# Patient Record
Sex: Male | Born: 1960 | Race: White | Hispanic: No | Marital: Married | Smoking: Never smoker
Health system: Southern US, Community
[De-identification: ages and names within clinical notes are randomized; demographics above are authoritative.]

## PROBLEM LIST (undated history)

## (undated) ENCOUNTER — Encounter (HOSPITAL_COMMUNITY): Admission: RE | Payer: Self-pay | Source: Ambulatory Visit

## (undated) DIAGNOSIS — E669 Obesity, unspecified: Secondary | ICD-10-CM

## (undated) DIAGNOSIS — I10 Essential (primary) hypertension: Secondary | ICD-10-CM

## (undated) DIAGNOSIS — L818 Other specified disorders of pigmentation: Secondary | ICD-10-CM

## (undated) DIAGNOSIS — Z87440 Personal history of urinary (tract) infections: Secondary | ICD-10-CM

## (undated) DIAGNOSIS — R002 Palpitations: Secondary | ICD-10-CM

## (undated) DIAGNOSIS — J329 Chronic sinusitis, unspecified: Secondary | ICD-10-CM

## (undated) DIAGNOSIS — H9319 Tinnitus, unspecified ear: Secondary | ICD-10-CM

## (undated) DIAGNOSIS — J45909 Unspecified asthma, uncomplicated: Secondary | ICD-10-CM

## (undated) DIAGNOSIS — H538 Other visual disturbances: Secondary | ICD-10-CM

## (undated) DIAGNOSIS — C8590 Non-Hodgkin lymphoma, unspecified, unspecified site: Secondary | ICD-10-CM

## (undated) DIAGNOSIS — C801 Malignant (primary) neoplasm, unspecified: Secondary | ICD-10-CM

## (undated) DIAGNOSIS — N419 Inflammatory disease of prostate, unspecified: Secondary | ICD-10-CM

## (undated) HISTORY — PX: HX LYMPH NODE DISSECTION: SHX13

## (undated) HISTORY — DX: Non-Hodgkin lymphoma, unspecified, unspecified site (CMS HCC): C85.90

## (undated) HISTORY — PX: HX COLONOSCOPY: 2100001147

## (undated) HISTORY — DX: Essential (primary) hypertension: I10

## (undated) HISTORY — PX: HX TONSILLECTOMY: SHX27

## (undated) HISTORY — PX: HX ADENOIDECTOMY: SHX29

## (undated) HISTORY — DX: Unspecified asthma, uncomplicated: J45.909

## (undated) HISTORY — PX: HX EYE SURGERY: 2100001143

## (undated) HISTORY — DX: Non-Hodgkin lymphoma, unspecified, unspecified site: C85.90

## (undated) SURGERY — CRANIOTOMY FOR TUMOR WITH NAVIGATION
Anesthesia: General

---

## 2013-07-28 ENCOUNTER — Ambulatory Visit (HOSPITAL_COMMUNITY): Payer: Self-pay | Admitting: Surgery

## 2013-08-02 ENCOUNTER — Other Ambulatory Visit: Payer: Self-pay | Admitting: Surgery

## 2013-08-02 ENCOUNTER — Encounter (FREE_STANDING_LABORATORY_FACILITY): Admit: 2013-08-02 | Discharge: 2013-08-02 | Disposition: A | Discharge status: Home / Self Care | Payer: Self-pay

## 2013-08-05 LAB — HISTORICAL FLOW CYTOMETRY, TISSUE

## 2013-08-07 ENCOUNTER — Ambulatory Visit (INDEPENDENT_AMBULATORY_CARE_PROVIDER_SITE_OTHER): Payer: BC Managed Care – PPO | Admitting: Otolaryngology

## 2013-08-07 ENCOUNTER — Ambulatory Visit (INDEPENDENT_AMBULATORY_CARE_PROVIDER_SITE_OTHER): Payer: BC Managed Care – PPO | Admitting: Audiologist

## 2013-08-07 ENCOUNTER — Encounter (INDEPENDENT_AMBULATORY_CARE_PROVIDER_SITE_OTHER): Payer: Self-pay | Admitting: Otolaryngology

## 2013-08-07 VITALS — Resp 16 | Ht 71.0 in | Wt 250.0 lb

## 2013-08-07 MED ORDER — BECLOMETHASONE DIPROPIONATE 80 MCG/ACTUATION NASAL HFA INHALER
160.0000 ug | INHALATION_SPRAY | Freq: Every day | NASAL | Status: DC
Start: 2013-08-07 — End: 2017-02-06

## 2013-08-07 MED ORDER — AZELASTINE 137 MCG (0.1 %) NASAL SPRAY AEROSOL
2.00 | INHALATION_SPRAY | Freq: Two times a day (BID) | NASAL | Status: DC
Start: 2013-08-07 — End: 2013-10-21

## 2013-08-07 MED ORDER — LEVOFLOXACIN 750 MG TABLET
750.0000 mg | ORAL_TABLET | Freq: Every day | ORAL | Status: DC
Start: 2013-08-07 — End: 2013-10-14

## 2013-08-07 MED ORDER — CETIRIZINE 10 MG TABLET
10.0000 mg | ORAL_TABLET | Freq: Every day | ORAL | Status: DC
Start: 2013-08-07 — End: 2013-10-14

## 2013-08-07 MED ORDER — MONTELUKAST 10 MG TABLET
10.00 mg | ORAL_TABLET | Freq: Every evening | ORAL | Status: DC
Start: 2013-08-07 — End: 2013-10-14

## 2013-08-07 NOTE — Progress Notes (Signed)
See scan.

## 2013-08-07 NOTE — Progress Notes (Signed)
[facility]  [address]  [place] [address] [address]  [phone]      Date: 08/07/2013  Name: Jason Ball  Age: 52 y.o.  DOB:      Chief Complaint: Abnormal results      History of Present Illness:   Patient presents for evaluation of enlarged cervical lymph nodes, sinus disease, and abnormal CT scan. He is currently on Levaquin for a sinus infection. He has been having sinus issues including nasal congestion, PND, facial pressure and pain and headache for more than 12 weeks. He has very thick yellow nasal drainage. It has been several years.  He is not currently on nasal steroids or antihistamines. He has been treated in the past by Dr. Fidela Juneau. He has not had a recent CT of his sinuses but recently had a CT soft tissue neck for lymphadenopathy. He had a left axillary lymph node biopsy performed by Dr. Rubye Oaks last week. He has not been out of the country in the past 21 days. His risk factors for HIV are negative. He has a positive history of asthma that is well controlled at this time. He works in [employer] [place]  He also complains of ringing in his ears, worse on the left side.      Past Medical History:     Past Medical History   Diagnosis Date   . HTN (hypertension)          Past Surgical History   Procedure Laterality Date   . Hx lymph node dissection       Current Outpatient Prescriptions   Medication Sig   . amLODIPine-benazepril (LOTREL) 5-20 mg Oral Capsule Take 1 Cap by mouth Once a day   . aspirin (ECOTRIN) 81 mg Oral Tablet, Delayed Release (E.C.) Take 81 mg by mouth Once a day   . Azelastine 137 mcg Nasal Aerosol, Spray 2 Sprays by Nasal route Twice daily Use in each nostril as directed   . beclomethasone dipropionate (QNASL) 80 mcg/actuation Nasal HFA Aerosol Inhaler 160 mcg by Nasal route Once a day   . cetirizine (ZYRTEC) 10 mg Oral Tablet Take 1 Tab (10 mg total) by mouth Once a day    . fluticasone (FLONASE) 50 mcg/actuation Nasal Spray, Suspension 1 Spray by Each Nostril route Once a day   . levofloxacin (LEVAQUIN) 500 mg Oral Tablet Take 500 mg by mouth Once a day   . levofloxacin (LEVAQUIN) 750 mg Oral Tablet Take 1 Tab (750 mg total) by mouth Once a day   . montelukast (SINGULAIR) 10 mg Oral Tablet Take 1 Tab (10 mg total) by mouth Every evening     No Known Allergies  Family History   Problem Relation Age of Onset   . HTN <20 y.o.     . Heart Disease       History   Substance Use Topics   . Smoking status: Never Smoker    . Smokeless tobacco: Never Used   . Alcohol Use: No        Review of Systems:     CONSTITUTIONAL: Patient denies any fatigue, weight loss, fever or chills.  EYES: Patient denies any double vision, blurred vision or loss of vision.  ENT: See HPI  CARDIOVASCULAR: Patient denies any heart racing.  RESPIRATORY: Patient denies any shortness of breath, noisy breathing, wheezing asthma or cough.   NEUROLOGICAL: Patient denies any numbness, tingling, seizures or headaches.  GI:  Patient denies any nausea, vomiting, indigestion, or heartburn.   GU: Patient denies any increased urinary frequency or painful urination.   ENDOCRINE: Patient denies having any brittle hair, hot or cold flashes.   SKIN: Patient denies any rashes or lesions.   MUSCULOSKELETAL: Patient denies any muscle aches or joint aches.  HEM/LYMPH: Patient denies any bleeding or easy bruising.  ALLERGIC/IMM: Patient denies any itchy eyes, ears, nose or palate, watery eyes, scratchy throat, or sneezing excessively.  PSYCHIATRIC: Patient denies any anxiety or depression.    Physical Examination:     Resp 16   Ht 1.803 m (5\' 11" )   Wt 113.399 kg (250 lb)   BMI 34.88 kg/m2    GENERAL: Patient is in no acute distress.  HEAD: Head is normocephalic, atraumatic. No palpable salivary gland masses.  FACE: Face is symmetric, cranial nerve 7 is intact bilaterally.  EYES: PERRL, EOMI. Sclera is white.   EARS: External auditory canals are clear. Tympanic membranes are translucent and health appearing bilaterally.   NOSE: Intranasally, no pus, polyps or epistaxis is appreciated. Mild DNS to the right. Bilateral turbinate hypertrophy.  ORAL CAVITY: Healthy appearing lips, teeth, tongue, and gums. There are no visible or palpable masses or lesions.  OROPHARYNX: Mild PND.  NECK: Trachea is midline. No masses are palpated.  LYMPH: Bilateral lymphadenopathy.  NEUROLOGICAL: Cranial nerves 2 through 12 are grossly intact.   SKIN: Skin is warm and dry to touch.  RESPIRATORY: No stridor.  MUSCULOSKELETAL: Extremities move equally well.  PSYCHIATRIC: Patient is pleasant, cooperative and alert.     Procedure:   The nasal cavity was sprayed with a mixture of afrin/lidocaine to decongest and anesthetize the nasal cavity.  A 0 degree rigid scope was first use to evaluate the right nasal cavity.  The middle meatus was free of pusor polyp.  The frontal recess was patent without evidence of pus or polyp.  The sphenoethmoid recess was then evaluated.  There is polypoid disease in the sphenoethmoid recess left greater than right.  No synechia were present within the nasal cavity.  The turbinates were enlarged bilaterally.  The patient tolerated the procedure well with minimal discomfort.         Data Reviewed:   Ct soft tissue neck on 07/28/13 showed numerous prominent and mildly enlarged cervical chain lymph nodes bilaterally. Extensive paranasal sinus inflammatory changes. There is complete opacification of the ethmoid air cells with resorptive changes of the medial wall left orbit.  Question of resorptive changes left cribiform region    Pathology report from axillary node biopsy was carefully reviewed    AUDIOGRAM (I have personally reviewed the study)  Hearing WNL through 2k Hz sloping to a mild SNHL with excellent SD AU    TYMPANOGRAM (I have personally and interpreted the study)  AD -   Ad  AS -  A      Assessment:        ICD-9-CM    1. 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  Lymphadenopathy 785.6    4. DNS (deviated nasal septum) 470 NASAL ENDOSCOPY DIAGNOSTIC-RIGID (AMB ONLY)     Azelastine 137 mcg Nasal Aerosol, Spray     cetirizine (ZYRTEC) 10 mg Oral Tablet     CT SINUSES WO IV CONTRAST     CT SINUSES WO IV CONTRAST   5. Nasal turbinate hypertrophy 478.0 beclomethasone dipropionate (QNASL) 80 mcg/actuation Nasal HFA Aerosol Inhaler     NASAL ENDOSCOPY DIAGNOSTIC-RIGID (AMB ONLY)     Azelastine 137 mcg Nasal Aerosol, Spray     cetirizine (ZYRTEC) 10 mg Oral Tablet     CT SINUSES WO IV CONTRAST     CT SINUSES WO IV CONTRAST   6. AR (allergic rhinitis) 477.9 montelukast (SINGULAIR) 10 mg Oral Tablet     cetirizine (ZYRTEC) 10 mg Oral Tablet     CT SINUSES WO IV CONTRAST     CT SINUSES WO IV CONTRAST   7. Tinnitus 388.30    8. History of asthma V12.69 montelukast (SINGULAIR) 10 mg Oral Tablet   9. SNHL (sensorineural hearing loss) 389.10        Plan:   [place] was seen today for abnormal results.    Diagnoses and associated orders for this visit:    Chronic sinusitis  - beclomethasone dipropionate (QNASL) 80 mcg/actuation Nasal HFA Aerosol Inhaler; 160 mcg by Nasal route Once a day  - Nasal Endoscopy  - Azelastine 137 mcg Nasal Aerosol, Spray; 2 Sprays by Nasal route Twice daily Use in each nostril as directed  - cetirizine (ZYRTEC) 10 mg Oral Tablet; Take 1 Tab (10 mg total) by mouth Once a day  - levofloxacin (LEVAQUIN) 750 mg Oral Tablet; Take 1 Tab (750 mg total) by mouth Once a day  - CT SINUSES WO IV CONTRAST; Future   - CT SINUSES WO IV CONTRAST  -     I am worried this gentleman may have allergic fungal sinusitis with bony erosion.  Malignancies are also not excluded.  I would like to re-evaluate him after the scan is performed  -     Qnasl 2 sprays each nostril daily. Astelin 2 sprays each nostril BID. Singulair 10 mg QHS. Zyrtec 10mg  daily. Extend Levaquin for 11 more days. Obtain CT sinuses after that treatment. Follow up after CT.    Possible Fungal sinusitis  - CT SINUSES WO IV CONTRAST; Future  - CT SINUSES WO IV CONTRAST    Lymphadenopathy  -     Dr. Rubye Oaks to discuss results of axillary LN biopsy on Monday.  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  Consider HIV testing, although he has no risk factors    DNS (deviated nasal septum)  - Nasal Endoscopy  - Azelastine 137 mcg Nasal Aerosol, Spray; 2 Sprays by Nasal route Twice daily Use in each nostril as directed  - cetirizine (ZYRTEC) 10 mg Oral Tablet; Take 1 Tab (10 mg total) by mouth Once a day  - CT SINUSES WO IV CONTRAST; Future  - CT SINUSES WO IV CONTRAST    Nasal turbinate hypertrophy  - beclomethasone dipropionate (QNASL) 80 mcg/actuation Nasal HFA Aerosol Inhaler; 160 mcg by Nasal route Once a day  - Nasal Endoscopy  - Azelastine 137 mcg Nasal Aerosol, Spray; 2 Sprays by Nasal route Twice daily Use in each nostril as directed  - cetirizine (ZYRTEC) 10 mg Oral Tablet; Take 1 Tab (10 mg total) by mouth Once a day  - CT SINUSES WO IV CONTRAST;  Future  - CT SINUSES WO IV CONTRAST    AR (allergic rhinitis)  - montelukast (SINGULAIR) 10 mg Oral Tablet; Take 1 Tab (10 mg total) by mouth Every evening  - cetirizine (ZYRTEC) 10 mg Oral Tablet; Take 1 Tab (10 mg total) by mouth Once a day  - CT SINUSES WO IV CONTRAST; Future  - CT SINUSES WO IV CONTRAST    Tinnitus   -     I have discussed the nature of tinnitus secondary to high frequency nerve loss.  I told the patient that this condition can get worse with time, so hearing protection is strongly recommended.  The use of background noise can be an effective means to control symptoms,        especially at night.  I also told the patient that the beta-flavenoid drug class has been shown in some studies to improve tinnitus related to high frequency SNHL and a drug such as LipoFlavenoid may be worth trying.  I also discussed the fact that hearing aids may be         beneficial if the nerve loss gets worse.    History of asthma  - montelukast (SINGULAIR) 10 mg Oral Tablet; Take 1 Tab (10 mg total) by mouth Every evening    SNHL (sensorineural hearing loss)  -     Recommend yearly audiograms. Hearing protection.    After a lengthy visit and evaluation we have discussed treatment for chronic rhinosinusitis and allergic rhinitis. I have informed the patient that there are several different ways to treat chronic allergies and chronic rhinosinusitis including the use of medications, several of which have been prescribed or continued today. In addition we have discussed the possibility of allergy testing and immunotherapy if positive. I also discussed the various surgical techniques that are available to address chronic rhinosinusitis. I have also discussed with the patient that often times multiple modalities are required to obtain optimal control of their sinus disease/allergies.              Geanie Cooley, LPN 29/52/8413, 11:58 AM    I have reviewed and confirmed the ROS, PFSH, and all other elements documented by the LPN. The scribed portion of the progress note was scribed on my behalf and at my direction.  I have reviewed and attest to the accuracy of the note.    Shelah Lewandowsky, MD 08/07/2013, 11:58 AM

## 2013-09-24 ENCOUNTER — Encounter (INDEPENDENT_AMBULATORY_CARE_PROVIDER_SITE_OTHER): Payer: Self-pay | Admitting: Otolaryngology

## 2013-09-24 ENCOUNTER — Ambulatory Visit (INDEPENDENT_AMBULATORY_CARE_PROVIDER_SITE_OTHER): Payer: BC Managed Care – PPO | Admitting: Otolaryngology

## 2013-09-24 VITALS — Resp 16 | Ht 71.0 in | Wt 250.0 lb

## 2013-09-24 DIAGNOSIS — J342 Deviated nasal septum: Secondary | ICD-10-CM

## 2013-09-24 DIAGNOSIS — Z8709 Personal history of other diseases of the respiratory system: Secondary | ICD-10-CM

## 2013-09-24 DIAGNOSIS — J343 Hypertrophy of nasal turbinates: Secondary | ICD-10-CM

## 2013-09-24 DIAGNOSIS — B49 Unspecified mycosis: Secondary | ICD-10-CM

## 2013-09-24 DIAGNOSIS — R9389 Abnormal findings on diagnostic imaging of other specified body structures: Secondary | ICD-10-CM

## 2013-09-24 DIAGNOSIS — J329 Chronic sinusitis, unspecified: Secondary | ICD-10-CM

## 2013-09-24 NOTE — Procedures (Signed)
See progress note.

## 2013-09-24 NOTE — Progress Notes (Signed)
[facility]  [address]  [name] [address] [address]  [phone]      Date: 09/24/2013  Name: Jason Ball  Age: 53 y.o.  DOB:      Chief Complaint: Chronic Sinusitis      History of Present Illness:   [name] Detter follows up today for CT results. He continues to complain of nasal congestion bilaterally. He is currently on Flagyl that his PCP put him on a few weeks ago. He is also on Qnasl, Astelin, Singulair and Zyrtec daily. Denies epistaxis with nasal sprays. He states that he has worked in [employer]. He is concerned that this may contribute to his problem. Chills and night sweats have resolved with the antibiotic treatment. He has seen Dr. Jonna Coup several times in the past for problems with his nose.  Apparently he has had surgery by Dr. Jonna Coup in the past as well.  Lymphadenopathy has improved significantly since seeing me last.    Past Medical History:     Past Medical History   Diagnosis Date    HTN (hypertension)          Past Surgical History   Procedure Laterality Date    Hx lymph node dissection       Current Outpatient Prescriptions   Medication Sig    amLODIPine-benazepril (LOTREL) 5-20 mg Oral Capsule Take 1 Cap by mouth Once a day    aspirin (ECOTRIN) 81 mg Oral Tablet, Delayed Release (E.C.) Take 81 mg by mouth Once a day    Azelastine 137 mcg Nasal Aerosol, Spray 2 Sprays by Nasal route Twice daily Use in each nostril as directed    beclomethasone dipropionate (QNASL) 80 mcg/actuation Nasal HFA Aerosol Inhaler 160 mcg by Nasal route Once a day    cetirizine (ZYRTEC) 10 mg Oral Tablet Take 1 Tab (10 mg total) by mouth Once a day    fluticasone (FLONASE) 50 mcg/actuation Nasal Spray, Suspension 1 Spray by Each Nostril route Once a day    levofloxacin (LEVAQUIN) 500 mg Oral Tablet Take 500 mg by mouth Once a day    levofloxacin (LEVAQUIN) 750 mg Oral Tablet Take 1 Tab (750 mg total) by mouth Once a day    montelukast (SINGULAIR) 10 mg Oral Tablet Take 1  Tab (10 mg total) by mouth Every evening     No Known Allergies  Family History   Problem Relation Age of Onset    HTN <21 y.o.      Heart Disease       History   Substance Use Topics    Smoking status: Never Smoker     Smokeless tobacco: Never Used    Alcohol Use: No        Review of Systems:     CONSTITUTIONAL: Patient denies any fatigue, weight loss, fever or chills.  EYES: Patient denies any double vision, blurred vision or loss of vision.  ENT: See HPI  CARDIOVASCULAR: Patient denies any heart racing.  RESPIRATORY: Patient denies any shortness of breath, noisy breathing, wheezing asthma or cough.   NEUROLOGICAL: Patient denies any numbness, tingling, seizures or headaches.  GI: Patient denies any nausea, vomiting, indigestion, or heartburn.   GU: Patient denies any increased urinary frequency or painful urination.   ENDOCRINE: Patient denies having any brittle hair, hot or cold flashes.   SKIN: Patient denies any rashes or lesions.   MUSCULOSKELETAL: Patient denies any muscle aches or joint aches.  HEM/LYMPH: Patient denies any bleeding  or easy bruising.  ALLERGIC/IMM: Patient denies any itchy eyes, ears, nose or palate, watery eyes, scratchy throat, or sneezing excessively.  PSYCHIATRIC: Patient denies any anxiety or depression.    Physical Examination:     Resp 16   Ht 1.803 m (5\' 11" )   Wt 113.399 kg (250 lb)   BMI 34.88 kg/m2    GENERAL: Patient is in no acute distress.  HEAD: Head is normocephalic, atraumatic. No palpable salivary gland masses.  FACE: Face is symmetric, cranial nerve 7 is intact bilaterally.  EYES: PERRL. Sclera is white.Extraocular movement WNL.  EARS: External auditory canals are clear. Tympanic membranes are translucent and health appearing bilaterally.   NOSE: Intranasally, no pus, polyps or epistaxis is appreciated.   ORAL CAVITY: Healthy appearing lips, teeth, tongue, and gums. There are no visible or palpable masses or lesions.  OROPHARYNX: Clear  NECK: Trachea is midline. No  masses are palpated.  LYMPH: No lymphadenopathy palpable in the neck.  NEUROLOGICAL: Cranial nerves 2 through 12 are grossly intact.   SKIN: Skin is warm and dry to touch.  RESPIRATORY: No stridor.  MUSCULOSKELETAL: Extremities move equally well.  PSYCHIATRIC: Patient is pleasant, cooperative and alert.     Procedure:     Procedure: Rigid Nasal Endoscopy   Patient Name                      D.O.B Jason Ball                         Performing Provider Hanley Seamen, MD        Correct Procedure Site Marked with yes No   Correct Procedure Site Verbalized Yes   Procedural Consent Obtained Yes - (verbal)   Correct Procedure Verbalized Yes       Time Out Performed  Yes @ 09/24/2013 2:21 PM    All Participants known or introduced Yes   List All Participants of the "time out" Mervyn Gay       Provider Signature Hanley Seamen, MD 09/24/2013 2:21 PM       The nasal cavity was sprayed with a mixture of afrin/lidocaine to decongest and anesthetize the nasal cavity.  A 0 degree rigid scope was first use to evaluate the right nasal cavity.  The middle meatus was free of pus.  The frontal recess was patent without evidence of pus or polyp.  The sphenoethmoid recess was then evaluated.  The sphenoethmoid recess was without evidence of pus or polyp.  No synechia were present within the nasal cavity.  Nasopharynx was then evaluated.  No masses were appreciated.  The same procedure was performed on the left side with similar findings.  The patient tolerated the procedure well with minimal discomfort.       The patient tolerated the procedure well, with no complications.         Data Reviewed:   I personally reviewed CT sinuses with patient.  I went over the findings and spent over 45 minutes discussing this with the patient.    Assessment:       ICD-9-CM    1. 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 DNS (deviated nasal septum) 470 OUTSIDE CONSULT/REFERRAL PROVIDER(AMB)     NASAL ENDOSCOPY DIAGNOSTIC-RIGID (AMB ONLY)  4. Nasal turbinate hypertrophy 478.0 OUTSIDE CONSULT/REFERRAL PROVIDER(AMB)     NASAL ENDOSCOPY DIAGNOSTIC-RIGID (AMB ONLY)   5. Abnormal CT scan 793.99 OUTSIDE CONSULT/REFERRAL PROVIDER(AMB)   6. History of asthma V12.69        Plan:   [place] was seen today for chronic sinusitis.    Diagnoses and associated orders for this visit:    Chronic sinusitis  - Refer to ENT  - Nasal Endoscopy  -     Concerned for allergic fungal sinusitis.  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  I do not think this represents a sinonasal malignancy given the chronicity, however, I did explain that this is on the differential.  I explained that he has a increased risk of CSF leak and orbital complications due to bony dehiscence and I feel he should have his procedure done by Dr. Ramadan.    Possible Fungal sinusitis  - Refer to ENT  - Nasal Endoscopy    DNS (deviated nasal septum)  - Refer to ENT  - Nasal Endoscopy    Nasal turbinate hypertrophy  - Refer to ENT  - Nasal Endoscopy    Abnormal CT scan  - Refer to ENT    History of asthma       Refer to Dr. Ramadan. Appt:09/27/13 at 10 AM. Continue Flagyl as ordered by his PCP. Continue current treatment.    Return if symptoms worsen or fail to improve.     Mercie Eon, LPN 11/25/7562, 3:32 PM    I have reviewed and confirmed the ROS, PFSH, and all other elements documented by the LPN. The scribed portion of the progress note was scribed on my behalf and at my direction.  I have reviewed and attest to the accuracy of the note.    Hanley Seamen, MD 09/24/2013, 2:21 PM

## 2013-09-24 NOTE — Patient Instructions (Signed)
Asthma, Adult  Asthma is a condition of the lungs in which the airways tighten and narrow. Asthma can make it hard to breathe. Asthma cannot be cured, but medicine and lifestyle changes can help control it. Asthma may be started (triggered) by:  · Animal skin flakes (dander).  · Dust.  · Cockroaches.  · Pollen.  · Mold.  · Smoke.  · Cleaning products.  · Hair sprays or aerosol sprays.  · Paint fumes or strong smells.  · Cold air, weather changes, and winds.  · Crying or laughing hard.  · Stress.  · Certain medicines or drugs.  · Foods, such as dried fruit, potato chips, and sparkling grape juice.  · Infections or conditions (colds, flu).  · Exercise.  · Certain medical conditions or diseases.  · Exercise or tiring activities.  HOME CARE   · Take medicine as told by your doctor.  · Use a peak flow meter as told by your doctor. A peak flow meter is a tool that measures how well the lungs are working.  · Record and keep track of the peak flow meter's readings.  · Understand and use the asthma action plan. An asthma action plan is a written plan for taking care of your asthma and treating your attacks.  · To help prevent asthma attacks:  · Do not smoke. Stay away from secondhand smoke.  · Change your heating and air conditioning filter often.  · Limit your use of fireplaces and wood stoves.  · Get rid of pests (such as roaches and mice) and their droppings.  · Throw away plants if you see mold on them.  · Clean your floors. Dust regularly. Use cleaning products that do not smell.  · Have someone vacuum when you are not home. Use a vacuum cleaner with a HEPA filter if possible.  · Replace carpet with wood, tile, or vinyl flooring. Carpet can trap animal skin flakes and dust.  · Use allergy-proof pillows, mattress covers, and box spring covers.  · Wash bed sheets and blankets every week in hot water and dry them in a dryer.  · Use blankets that are made of polyester or cotton.  · Clean bathrooms and kitchens with bleach.  If possible, have someone repaint the walls in these rooms with mold-resistant paint. Keep out of the rooms that are being cleaned and painted.  · Wash hands often.  GET HELP IF:  · You have make a whistling sound when breaking (wheeze), have shortness of breath, or have a cough even if taking medicine to prevent attacks.  · The colored mucus you cough up (sputum) is thicker than usual.  · The colored mucus you cough up changes from clear or white to yellow, green, gray, or bloody.  · You have problems from the medicine you are taking such as:  · A rash.  · Itching.  · Swelling.  · Trouble breathing.  · You need reliever medicines more than 2 3 times a week.  · Your peak flow measurement is still at 50 79% of your personal best after following the action plan for 1 hour.  GET HELP RIGHT AWAY IF:   · You seem to be worse and are not responding to medicine during an asthma attack.  · You are short of breath even at rest.  · You get short of breath when doing very little activity.  · You have trouble eating, drinking, or talking.  · You have chest pain.  ·   You have a fast heartbeat.  · Your lips or fingernails start to turn blue.  · You are lightheaded, dizzy, or faint.  · Your peak flow is less than 50% of your personal best.  · You have a fever or lasting symptoms for more than 2 3 days.  · You have a fever and your symptoms suddenly get worse.  MAKE SURE YOU:   · Understand these instructions.  · Will watch your condition.  · Will get help right away if you are not doing well or get worse.  Document Released: 02/22/2008 Document Revised: 06/26/2013 Document Reviewed: 04/04/2013  [employer]® Patient Information ©2014 [employer].

## 2013-09-25 ENCOUNTER — Encounter (INDEPENDENT_AMBULATORY_CARE_PROVIDER_SITE_OTHER): Payer: Self-pay | Admitting: Otolaryngology

## 2013-09-25 ENCOUNTER — Ambulatory Visit: Payer: BC Managed Care – PPO | Attending: Otolaryngology | Admitting: Otolaryngology

## 2013-09-25 VITALS — BP 144/76 | HR 92 | Temp 98.3°F | Ht 72.13 in | Wt 265.7 lb

## 2013-09-25 DIAGNOSIS — J3489 Other specified disorders of nose and nasal sinuses: Secondary | ICD-10-CM

## 2013-09-25 DIAGNOSIS — R930 Abnormal findings on diagnostic imaging of skull and head, not elsewhere classified: Secondary | ICD-10-CM | POA: Insufficient documentation

## 2013-09-25 DIAGNOSIS — J330 Polyp of nasal cavity: Secondary | ICD-10-CM | POA: Insufficient documentation

## 2013-09-25 DIAGNOSIS — J329 Chronic sinusitis, unspecified: Secondary | ICD-10-CM

## 2013-09-25 DIAGNOSIS — R220 Localized swelling, mass and lump, head: Secondary | ICD-10-CM

## 2013-09-25 DIAGNOSIS — R221 Localized swelling, mass and lump, neck: Secondary | ICD-10-CM

## 2013-09-25 DIAGNOSIS — J45909 Unspecified asthma, uncomplicated: Secondary | ICD-10-CM | POA: Insufficient documentation

## 2013-09-25 DIAGNOSIS — I10 Essential (primary) hypertension: Secondary | ICD-10-CM | POA: Insufficient documentation

## 2013-09-25 NOTE — Procedures (Addendum)
Procedure: rigid nasal endoscopy  Pre-operative Dx: nasal mass   Post-operative Dx: same    Description: A 30-degree rigid endoscope was passed into the nose with the following findings: On the left there is a polypoid lesion sticking off the medial edge of the middle turbinate. Otherwise no obvious abnormality is seen. The nose is tight bilaterally.  The patient tolerated the procedure well.  Barbette Merino Ramadan, MD  was present for the procedure.    Overton Mam, MD 3:22 PM 09/25/2013  Otolaryngology-Head and Neck Surgery  Resident  Pager # [phone]    I was present for the entire viewing portion of the endoscopy from insertion to removal of scope.    Barbette Merino Ramadan, MD 09/26/2013, 2:03 PM

## 2013-09-26 NOTE — H&P (Addendum)
[place], [place] [address]                                PATIENT NAME: Jason Ball, Jason Ball [id number]  DATE OF SERVICE:09/25/2013  DATE OF BIRTH:     HISTORY AND PHYSICAL    CHIEF COMPLAINT:  Possible fungal sinusitis.    HISTORY OF PRESENT ILLNESS:  Jason Ball is a 53 year old male who was referred to Dr. Ramadan by Dr. Lorenza Cambridge due to concern for allergic fungal sinusitis.  The patient notes that he has had about 3 or 4 months of swollen lymph nodes in his left neck as well as sinus pressure and headaches.  He has been treated a few times for this, once by Dr. Lorenza Cambridge with Levaquin that did seem to help; however, after about a week of that, his symptoms returned and thus his PCP placed him on Flagyl and he is actually currently on Flagyl and notes that he feels significantly better now than he did.  His pain in his neck, headaches and sinus pressure have all decreased.  He denies any significant drainage from the nose.  He does note that he is congested at baseline and his sense of smell is decreased from what it used to be.  The patient was also placed about a month ago on Qnasl and Astelin, which he says have helped his symptoms.  He does also take Singulair and Zyrtec for allergies.  There is a somewhat unclear history of lymphadenopathy.  The patient notes that he was found to have lymphadenopathy in several places in his body and actually a month ago at [facility], underwent a right axillary lymph node removal and was told that was benign.  The patient has no other history of cancer or family history of cancer and is otherwise fairly healthy.  A CT scan of the sinuses was obtained by Dr. Lorenza Cambridge and showed concerns of fungal sinusitis and thus was referred to Dr. Ramadan.    Past Medical History   Diagnosis Date    HTN (hypertension)      Asthma        PAST SURGICAL HISTORY:    Past Surgical History   Procedure Laterality Date    Hx lymph node dissection          MEDICATIONS:    Outpatient Prescriptions Marked as Taking for the 09/25/13 encounter (Office Visit) with Ramadan, Barbette Merino, MD   Medication Sig    amLODIPine-benazepril (LOTREL) 5-20 mg Oral Capsule Take 1 Cap by mouth Once a day    aspirin (ECOTRIN) 81 mg Oral Tablet, Delayed Release (E.C.) Take 81 mg by mouth Once a day    Azelastine 137 mcg Nasal Aerosol, Spray 2 Sprays by Nasal route Twice daily Use in each nostril as directed    beclomethasone dipropionate (QNASL) 80 mcg/actuation Nasal HFA Aerosol Inhaler 160 mcg by Nasal route Once a day    cetirizine (ZYRTEC) 10 mg Oral Tablet Take 1 Tab (10 mg total) by mouth Once a day  fluticasone (FLONASE) 50 mcg/actuation Nasal Spray, Suspension 1 Spray by Each Nostril route Once a day    levofloxacin (LEVAQUIN) 500 mg Oral Tablet Take 500 mg by mouth Once a day    levofloxacin (LEVAQUIN) 750 mg Oral Tablet Take 1 Tab (750 mg total) by mouth Once a day    montelukast (SINGULAIR) 10 mg Oral Tablet Take 1 Tab (10 mg total) by mouth Every evening       FAMILY HISTORY:   Family History   Problem Relation Age of Onset    HTN <20 y.o.      Heart Disease      Cancer Father        SOCIAL HISTORY:    History   Substance Use Topics    Smoking status: Never Smoker     Smokeless tobacco: Never Used    Alcohol Use: No       REVIEW OF SYSTEMS: Do you have any fevers: no   Any weight change: no   Change in your vision: yes Explain Vision: "blurry, pressure"  Chest Pain: no   Shortness of Breath: no   Stomach pain: no   Urinary difficulity: yes Explain Urinary Difficulty: "some infections, UTI and prostate" Joint Pain: no   Skin Problems: no   Weakness or Numbness: no   Easy Bruising or Bleeding: no   Excessive Thirst: no   Seasonal Allergies: no  .    All other systems reviewed and were found to be negative.    ALLERGIES:  No Known  Allergies    PHYSICAL EXAMINATION:  Blood pressure 144/76, pulse 92, temperature 98.3 degrees Fahrenheit, weight 124.5 kg, height 183.2 cm.  General appearance:  Pleasant, cooperative, healthy and in no acute distress.  Eyes:  Conjunctivae/corneas clear, PERRLA, EOMs intact.  Head and face:  Normocephalic, atraumatic.  Face symmetric, no obvious lesions.   Pinnae:  Normal shape and position.   External auditory canals:  Patent without inflammation.  Tympanic membranes:  Intact, translucent, midposition, middle ear aerated.  Nose:  External pyramid midline.  Anterior rhinoscopy reveals bilateral nasal mucosa does appear dry and nasal airways are fairly tight.  Please see procedure note for further details.    Oral cavity/oropharynx:  No mucosal lesions, masses or pharyngeal asymmetry.  Neck:  No palpable thyroid, salivary gland or neck masses.  Hem/lymph:  No cervical adenopathy.  Cardiovascular:  Good perfusion of upper extremities. No cyanosis of the hands or fingers.  Lungs:  No apparent stridulous breathing.  In no acute distress.  Skin:  Warm and dry.  Neurologic:  Cranial nerves grossly intact.  Psychiatric:  Alert and oriented x 3.       SPECIAL PROCEDURES:  Procedure: rigid nasal endoscopy  Pre-operative Dx: nasal mass   Post-operative Dx: same    Description: A 30-degree rigid endoscope was passed into the nose with the following findings: On the left there is a polypoid lesion sticking off the medial edge of the middle turbinate. Otherwise no obvious abnormality is seen. The nose is tight bilaterally.  The patient tolerated the procedure well.  Barbette Merino Ramadan, MD  was present for the procedure.    Overton Mam, MD 3:22 PM 09/25/2013  Otolaryngology-Head and Neck Surgery  Resident  Pager # [phone]           DATA REVIEWED:  CT scan of the sinuses from September 01, 2013, was reviewed on PACS to our interpretation.  There is some mucosal thickening diffusely in all paranasal sinuses;  however, in bilateral  ethmoids there is complete opacification on the left, near complete opacification on the right and bilateral frontal sinuses are also opacified.  On the left, in the left ethmoid specifically it does appear to be eroding somewhat through the lamina papyracea and invading the orbit.  There is questionable erosion as well of the fovea ethmoidalis and there may be some soft tissue pushing up into the anterior cranial fossa.  We do see some concretions that are suggestive of a fungal sinusitis.    ASSESSMENT:  A 53 year old male with ethmoid opacification that is concerning for fungal sinusitis.  However, a mass cannot be ruled out.    ICD-9-CM    1. Sinusitis, chronic 473.9 MRI SKULL BASE ( FACE, SINUSES, NASOPHARNX ) W/WO CONTRAST     NASAL ENDOSCOPY DIAGNOSTIC-RIGID (AMB ONLY)   2. 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  However, the exact etiology cannot be defined as of yet.  We will have him obtain an MRI of the skull base and sinuses to further delineate the soft tissue and also give [place] a better idea as to the nature of its invasion into the brain and orbit.  At that time, we will call the patient with results and make further decisions as to what needs to be done at that point.      Jinny Sanders, MD  Resident  [facility] Department of Otolaryngology    I saw and examined the patient.  I reviewed the resident's note.  I agree with the findings and plan of care as documented in the resident's note.  Any exceptions/additions are edited/noted.    Barbette Merino Ramadan, MD 09/26/2013, 2:04 PM    Sheryle Hail Ramadan, MD  Professor and [facility] Department of Otolaryngology    WN/[id number]; D: 09/26/2013 07:09:31; T: 09/26/2013 07:30:18

## 2013-09-27 ENCOUNTER — Ambulatory Visit (INDEPENDENT_AMBULATORY_CARE_PROVIDER_SITE_OTHER): Payer: Self-pay | Admitting: Otolaryngology

## 2013-09-28 ENCOUNTER — Ambulatory Visit
Admission: RE | Admit: 2013-09-28 | Discharge: 2013-09-28 | Disposition: A | Discharge status: Home / Self Care | Payer: BC Managed Care – PPO | Source: Ambulatory Visit | Attending: Otolaryngology | Admitting: Otolaryngology

## 2013-09-28 DIAGNOSIS — J329 Chronic sinusitis, unspecified: Secondary | ICD-10-CM

## 2013-09-28 DIAGNOSIS — J3489 Other specified disorders of nose and nasal sinuses: Secondary | ICD-10-CM

## 2013-10-04 ENCOUNTER — Ambulatory Visit
Admission: RE | Admit: 2013-10-04 | Discharge: 2013-10-04 | Disposition: A | Discharge status: Home / Self Care | Payer: BC Managed Care – PPO | Source: Ambulatory Visit | Attending: Otolaryngology | Admitting: Otolaryngology

## 2013-10-04 DIAGNOSIS — D491 Neoplasm of unspecified behavior of respiratory system: Secondary | ICD-10-CM

## 2013-10-04 MED ORDER — GADOPENTETATE DIMEGLUMINE 10 MMOL/20 ML(469.01 MG/ML) INTRAVENOUS SOLN
20.00 mL | INTRAVENOUS | Status: AC
Start: 2013-10-04 — End: 2013-10-04
  Administered 2013-10-04: 20:00:00 20 mL via INTRAVENOUS

## 2013-10-08 ENCOUNTER — Ambulatory Visit: Payer: BC Managed Care – PPO | Attending: Otolaryngology | Admitting: Otolaryngology

## 2013-10-08 ENCOUNTER — Other Ambulatory Visit (INDEPENDENT_AMBULATORY_CARE_PROVIDER_SITE_OTHER): Payer: Self-pay

## 2013-10-08 DIAGNOSIS — R221 Localized swelling, mass and lump, neck: Secondary | ICD-10-CM

## 2013-10-08 DIAGNOSIS — J45909 Unspecified asthma, uncomplicated: Secondary | ICD-10-CM | POA: Insufficient documentation

## 2013-10-08 DIAGNOSIS — D4989 Neoplasm of unspecified behavior of other specified sites: Secondary | ICD-10-CM | POA: Insufficient documentation

## 2013-10-08 DIAGNOSIS — R220 Localized swelling, mass and lump, head: Secondary | ICD-10-CM

## 2013-10-08 DIAGNOSIS — J3489 Other specified disorders of nose and nasal sinuses: Secondary | ICD-10-CM

## 2013-10-08 DIAGNOSIS — I10 Essential (primary) hypertension: Secondary | ICD-10-CM | POA: Insufficient documentation

## 2013-10-09 ENCOUNTER — Other Ambulatory Visit (INDEPENDENT_AMBULATORY_CARE_PROVIDER_SITE_OTHER): Payer: BC Managed Care – PPO

## 2013-10-09 NOTE — H&P (Addendum)
PATIENT NAME:  Jason Ball  MRN:  [id number]  DOB:    DATE OF SERVICE: 10/08/2013    Chief Complaint:  No chief complaint on file.      HPI:  Jason Ball is a 53 y.o. male presenting for follow up after MRI sinuses. He was referred initially from Dr Lorenza Cambridge for possible nasal tumor versus AFRS. He continues to have pressure around his left eye and sinuses, along with headaches.     Past Medical History:  Past Medical History   Diagnosis Date    HTN (hypertension)     Asthma        Past Surgical History:  Past Surgical History   Procedure Laterality Date    Hx lymph node dissection         Family History:  Family History   Problem Relation Age of Onset    HTN <20 y.o.      Heart Disease      Cancer Father        Social History:  History   Smoking status    Never Smoker    Smokeless tobacco    Never Used     History   Alcohol Use No     Social History     Occupational History    Not on file.       Medications:  None     Allergies:  No Known Allergies    Review of Systems:                                                        All other systems reviewed and found to be negative.    Physical Exam:  There were no vitals taken for this visit.  There is no weight on file to calculate BMI.  General Appearance: Pleasant, cooperative, healthy, and in no acute distress.  Eyes: Conjunctivae/corneas clear  Head and Face: Normocephalic, atraumatic.  Face symmetric, no obvious lesions.   Nose:  External pyramid midline. Nose is tight bilaterally with big inferior turbinates. Mucosa normal.   Psychiatric:  Alert and oriented x 3.      Data Reviewed:  MRI skull base and sinuses on PACS from 10/09/13: a non-enhancing mass in the left ethmoid and frontal sinus with orbital invasion and likely dural invasion.     Assessment:  Left nasal tumor    Plan:  We recommend endoscopic biopsy with brain lab: this is just going to be a biopsy for diagnosis: Risks and benefits as well as alternatives discussed with the patient. All  questions were answered. He agreed to proceed with surgery. Consent obtained.   Refer to oculoplastics and neurosurgery services for planning for surgical excision once we have a diagnosis  Follow up on day of surgery. We will get CT lab sinuses on day of surgery      Nella Botsford A. Wilder Glade, MD  PGY-5 resident  Department of Otolaryngology, [facility]    I saw and examined the patient.  I reviewed the resident's note.  I agree with the findings and plan of care as documented in the resident's note.  Any exceptions/additions are edited/noted.    Barbette Merino Ramadan, MD 10/09/2013, 9:44 AM    Sheryle Hail Ramadan, MD    PCP:  Dolores Lory, DO  [address]  [address]  [place] [address]   REF:  No referring provider defined for this encounter.

## 2013-10-14 ENCOUNTER — Encounter (HOSPITAL_COMMUNITY): Payer: Self-pay

## 2013-10-14 ENCOUNTER — Encounter (HOSPITAL_COMMUNITY): Payer: Self-pay | Admitting: Family

## 2013-10-14 ENCOUNTER — Ambulatory Visit
Admission: RE | Admit: 2013-10-14 | Discharge: 2013-10-14 | Disposition: A | Discharge status: Home / Self Care | Payer: BC Managed Care – PPO | Source: Ambulatory Visit | Attending: Otolaryngology | Admitting: Otolaryngology

## 2013-10-14 ENCOUNTER — Ambulatory Visit (HOSPITAL_BASED_OUTPATIENT_CLINIC_OR_DEPARTMENT_OTHER)
Admission: RE | Admit: 2013-10-14 | Discharge: 2013-10-14 | Disposition: A | Discharge status: Home / Self Care | Payer: BC Managed Care – PPO | Source: Ambulatory Visit | Attending: Otolaryngology | Admitting: Otolaryngology

## 2013-10-14 VITALS — BP 146/96 | HR 81 | Temp 97.2°F | Resp 20 | Ht 71.77 in | Wt 271.2 lb

## 2013-10-14 DIAGNOSIS — I10 Essential (primary) hypertension: Secondary | ICD-10-CM | POA: Insufficient documentation

## 2013-10-14 DIAGNOSIS — R220 Localized swelling, mass and lump, head: Secondary | ICD-10-CM | POA: Insufficient documentation

## 2013-10-14 DIAGNOSIS — R221 Localized swelling, mass and lump, neck: Secondary | ICD-10-CM | POA: Insufficient documentation

## 2013-10-14 DIAGNOSIS — E669 Obesity, unspecified: Secondary | ICD-10-CM | POA: Insufficient documentation

## 2013-10-14 DIAGNOSIS — R002 Palpitations: Secondary | ICD-10-CM | POA: Insufficient documentation

## 2013-10-14 DIAGNOSIS — J45909 Unspecified asthma, uncomplicated: Secondary | ICD-10-CM | POA: Insufficient documentation

## 2013-10-14 DIAGNOSIS — J3489 Other specified disorders of nose and nasal sinuses: Secondary | ICD-10-CM

## 2013-10-14 DIAGNOSIS — J329 Chronic sinusitis, unspecified: Secondary | ICD-10-CM | POA: Insufficient documentation

## 2013-10-14 DIAGNOSIS — Z01818 Encounter for other preprocedural examination: Secondary | ICD-10-CM | POA: Insufficient documentation

## 2013-10-14 DIAGNOSIS — Z419 Encounter for procedure for purposes other than remedying health state, unspecified: Secondary | ICD-10-CM

## 2013-10-14 HISTORY — DX: Other visual disturbances: H53.8

## 2013-10-14 HISTORY — DX: Tinnitus, unspecified ear: H93.19

## 2013-10-14 HISTORY — DX: Palpitations: R00.2

## 2013-10-14 HISTORY — DX: Other specified disorders of pigmentation: L81.8

## 2013-10-14 HISTORY — DX: Obesity, unspecified: E66.9

## 2013-10-14 HISTORY — DX: Chronic sinusitis, unspecified: J32.9

## 2013-10-14 HISTORY — DX: Essential (primary) hypertension: I10

## 2013-10-14 LAB — BASIC METABOLIC PANEL
ANION GAP: 10 mmol/L (ref 4–13)
BUN/CREAT RATIO: 13 (ref 6–22)
BUN: 11 mg/dL (ref 8–25)
CALCIUM: 8.8 mg/dL (ref 8.5–10.4)
CARBON DIOXIDE: 23 mmol/L (ref 22–32)
CHLORIDE: 108 mmol/L (ref 96–111)
CHLORIDE: 108 mmol/L (ref 96–111)
CREATININE: 0.85 mg/dL (ref 0.62–1.27)
CREATININE: 0.85 mg/dL (ref 0.62–1.27)
ESTIMATED GLOMERULAR FILTRATION RATE: >59 ml/min/1.73m2 (ref >59)
GLUCOSE,NONFAST: 104 mg/dL (ref 65–139)
POTASSIUM: 4.5 mmol/L (ref 3.5–5.1)
SODIUM: 141 mmol/L (ref 136–145)

## 2013-10-14 LAB — PERFORM POC WHOLE BLOOD GLUCOSE: GLUCOSE, POINT OF CARE: 112 mg/dL — ABNORMAL HIGH (ref 70–105)

## 2013-10-14 NOTE — Anesthesia Preprocedure Evaluation (Addendum)
Physical Exam:     Airway       Mallampati: II    TM distance: >3 FB    Neck ROM: full  Mouth Opening: good.  Facial hair  Beard  No endotracheal tube present  No Tracheostomy present    Dental       Dentition intact             Pulmonary    Breath sounds clear to auscultation  (-) no rhonchi, no decreased breath sounds, no wheezes, no rales and no stridor     Cardiovascular    Rhythm: regular  Rate: Normal  (-) no friction rub and no murmur     Other findings            Anesthesia Plan:  Planned anesthesia type: general  ASA 3     Intravenous induction   Patient's NPO status is appropriate for Anesthesia.    Anesthetic plan and risks discussed with patient, spouse and other.    Anesthesia issues/risks discussed are: Dental Injuries, Difficult Airway, Cardiac Events/MI, Stroke, Intraoperative Awareness/ Recall, Post-op Pain Management, PONV, Post-op Cognitive Dysfunction and Aspiration.        Plan discussed with CRNA.                    EKG Ordered: 10/14/2013  CXR: Not ordered  Other Studies: Labs 10/14/2013 in PAU    Consults: None    STOP BANG Score (0-8): 7 - Letter sent to PCP    Patient instructed to hold all medications day of surgery. Pt instructed to stop Lotrel 24 hours before surgery. Pt instructed to stop Aspirin 7 days before surgery. Anesthesia consent given to patient to review. Pt instructed to direct any questions or concerns to anesthesiologist the day of surgery.

## 2013-10-15 ENCOUNTER — Ambulatory Visit (HOSPITAL_BASED_OUTPATIENT_CLINIC_OR_DEPARTMENT_OTHER): Payer: BC Managed Care – PPO | Admitting: Ophthalmology

## 2013-10-15 ENCOUNTER — Encounter (INDEPENDENT_AMBULATORY_CARE_PROVIDER_SITE_OTHER): Payer: Self-pay | Admitting: Neurological Surgery

## 2013-10-15 ENCOUNTER — Ambulatory Visit
Admission: RE | Admit: 2013-10-15 | Discharge: 2013-10-15 | Disposition: A | Discharge status: Home / Self Care | Payer: BC Managed Care – PPO | Source: Ambulatory Visit | Attending: Neurological Surgery | Admitting: Neurological Surgery

## 2013-10-15 ENCOUNTER — Ambulatory Visit (INDEPENDENT_AMBULATORY_CARE_PROVIDER_SITE_OTHER): Payer: BC Managed Care – PPO | Admitting: Neurological Surgery

## 2013-10-15 VITALS — BP 144/84 | HR 88 | Temp 97.0°F | Ht 71.65 in | Wt 276.9 lb

## 2013-10-15 DIAGNOSIS — IMO0001 Reserved for inherently not codable concepts without codable children: Secondary | ICD-10-CM

## 2013-10-15 DIAGNOSIS — R220 Localized swelling, mass and lump, head: Principal | ICD-10-CM | POA: Insufficient documentation

## 2013-10-15 DIAGNOSIS — H538 Other visual disturbances: Secondary | ICD-10-CM

## 2013-10-15 DIAGNOSIS — H04209 Unspecified epiphora, unspecified lacrimal gland: Secondary | ICD-10-CM

## 2013-10-15 DIAGNOSIS — J3489 Other specified disorders of nose and nasal sinuses: Secondary | ICD-10-CM

## 2013-10-15 DIAGNOSIS — R221 Localized swelling, mass and lump, neck: Secondary | ICD-10-CM | POA: Insufficient documentation

## 2013-10-15 DIAGNOSIS — H524 Presbyopia: Secondary | ICD-10-CM

## 2013-10-15 NOTE — Progress Notes (Addendum)
[place]  Operated by [facility]  [address]  [place] [address]  Dept: [place] [facility] Note    10/15/2013    Patient Name: Jason Ball [facility]    Date of Birth:       Chief Complaint    Eye Problem          HPI    Pt is here today for tumor in OS.  Pt states he noticed OU were tearing some.  He states his OU were a little bit blurry.  He states  This started about 3-4 mos ago.  He states he has more trouble when he reads.  Glasses do help some  Pt states he has some sinusitis.  No eye pain no diplopia.  Pt states he had a CT scan done yesterday here at [facility].            ROS    Positive for: Eyes (tearing)    Negative for: Constitutional, Gastrointestinal, Neurological, Skin, Genitourinary, Musculoskeletal, HENT, Endocrine, Cardiovascular, Respiratory, Psychiatric, Allergic/Imm, Heme/Lymph        The patient is a 53 y.o. male here with complaint of:       Past Ocular history:  BUL lids lifted    Kristy Pantojas, COA 10/15/2013, 3:31 PM    MD Addition to HPI: Pt states he has a tumor OS. Has tearing OU. Reports blurry vision 3-4 months ago and has more trouble while reading. Wears glasses. States he has intermittent sinus problems. Denies eye pain or diplopia. Pt reports he had a CT scan yesterday. Pt has a biopsy scheduled with Dr. Ramadan on 10/21/13.         Impression(s):      ICD-9-CM    1. Nasal mass 784.2 AMB CONSULT/REFERRAL OPHTHALMOLOGY     OPH EXTERNAL PHOTOS     OPH 3 ISOPTERS VF   2. Blurry vision 368.8    3. Tearing 375.20    4. Presbyopia 367.4        Plan(s)/Recommendation(s):    53yo man c hx of nasal mass with L skull base mass eroding medial wall and cribiform area  Photo  Agree with plan for biopsy with Dr. Ramadan  If needed further surgery, will be available for combined cases    Refractive error  Continue glasses    Blepharitis with epiphora no sign of lid laxity  Lid scrub and warm compresses    Imaging (CT/MRI): Large  ethmoidal/sphenoid mass with erosion of L medial wall and cribiform area       SURGERY    Procedure: Available for L orbitotomy via craniotomy approach with Dr. Constance Holster    Diagnosis:     ICD-9-CM    1. Nasal mass 784.2 AMB CONSULT/REFERRAL OPHTHALMOLOGY     OPH EXTERNAL PHOTOS     OPH 3 ISOPTERS VF       Anesthesia: GENERAL    Length of time: 360 minutes    Special Needs:    I saw and examined the patient.  I reviewed the technician note.  Any exceptions/additions are edited/noted.    Veto Kemps, MD 10/15/2013  3:31 PM      Eye Examination:      Neuro/Psych   Oriented x3: Yes   Mood/Affect: Normal         Visual Acuity    Right Left   Dist cc 20/30 +2 20/20   Method: Snellen - Linear   Correction: Glasses  Edited by: Cindra Presume, COA      Not recorded      Not recorded      Color    Right Left   Color 14/14 14/14   Method: Caprice Red by: Cindra Presume, COA      Visual Fields    Right Left   Result Full Full           Method: Counting fingers       Edited by: Cindra Presume, COA      Pupils    Pupils APD   Right PERRL None   Left PERRL None         Extraocular Movement    Right Left   0 0 0  0 0 0   0  0  0  0   0 0 0  0 0 0          Edited by: Cindra Presume, COA             ortho           Sensation   V1: Intact bilaterally   V2: Intact bilaterally   V3: Intact bilaterally    Facial Contours:     Orbit   Exophthalmometry: OD: 20      OS:  21    Eyelids:  Lid edema WR:UEAV OS: None Conj chem OD: None [id number]   Lid inject OD: None OS: None Conj inject OD: None OS: None             PF   MRD                       LF   LC                       USS   LSS                 0   LAG       0      External Exam    Right Left   External Normal Normal   Slit Lamp Exam    Right Left   Lids/Lashes Normal Normal   Conjunctiva/Sclera White and quiet White and quiet   Cornea Clear Clear   Anterior Chamber Deep and quiet Deep and quiet   Iris Round and reactive Round and reactive   Lens Nuclear sclerosis  Nuclear sclerosis   Vitreous Normal Normal   Fundus Exam    Right Left   Disc Normal Normal   C/D Ratio 0.2 0.2   Macula Normal Normal   Vessels Normal Normal   Periphery Normal Normal          IOP straight  Tonometry    Right Left   Pressure 14 17   Method: Tonopen   Time: 3:30 PM       Edited by: Cindra Presume, COA         Upgaze     5 down      Past Medical / Surgical / Family / Social History:     Past Medical History   Diagnosis Date    HTN (hypertension)     Asthma      well controlled without medications    Upper respiratory infection      sinusitis 2 weeks ago - cleared up    Hypertension     Palpitations  with anxiety    Ringing in ears     Blurred vision     Obesity     Sinusitis, chronic     Tattoos      x1 left upper arm       Past Surgical History   Procedure Laterality Date    Hx lymph node dissection      Hx eye surgery       eyelid lift - 2013    Hx tonsillectomy      Hx adenoidectomy      Hx colonoscopy         Family History   Problem Relation Age of Onset    HTN <20 y.o.      Heart Disease      Cancer Father      unknown    Other Mother      early Parkinson's    No Known Problems Brother     No Known Problems Son     No Known Problems Daughter     No Known Problems Son        History   Substance Use Topics    Smoking status: Never Smoker     Smokeless tobacco: Never Used    Alcohol Use: Yes      Comment: 1 beer every 3 months       I have seen and examined the above patient. I discussed the above diagnoses listed in the assessment and the above ophthalmic plan of care with the patient and patient's family. All questions were answered. I reviewed and, when necessary, made changes to the technician/resident note, documented ophthalmology exam, chief complaint, history of present illness, allergies, review of systems, past medical, past surgical, family and social history.    Veto Kemps, MD 10/15/2013  3:31 PM    I scribed a portion of the encounter including Chief  Complaint, HPI, Impression, Plan, and exam elements excluding visual acuity, lensometry, pupil assessment, confrontational visual fields, tonometry results, extraocular motility assessment, refractometry results, and assessment of mood and orientation. I scribed this note at the request of the physician who personally performed the services documented.  [place], [place] 10/15/2013, 3:38 PM    I have reviewed and confirmed the ROS, PFSH, and exam elements performed and documented by the technician. The scribed portion of the progress note was scribed on my behalf and at my direction. I have reviewed and attest to the accuracy of the note.   Veto Kemps, MD 10/29/2013, 08:05

## 2013-10-15 NOTE — H&P (Addendum)
[facility] of [facility]  New Outpatient Note      Jason Ball  DOB:   Gender: male  Date of Service: 10/15/2013  Referring physician: Ramadan, Barbette Merino, MD  [address]  [address]  [place], [place] [address]  Primary Care Provider: Dolores Lory, DO    CHIEF COMPLAINT: nasal cavity mass    Reliable source of information: patient and MR review.    HISTORY OF PRESENT ILLNESS:  Jason Ball is a 53 y.o. male who presents for eval left nasal cavity mass discovered during w/u for chronic sinusitis and cervical lmphadenopathy, referral from ENT, Dr. Ramadan. He says he was in his usual state of health up until Oct 2014 when he started to have s/s sinusitis: nasal congestion, facial pressure, and HA. He then developed cervical lymphadenopathy: was managed with Levaquin and flagyl with some improvement per Dr. Lorenza Cambridge in ENT at [facility]. A CT of sinuses was abnormal and he was referred to ENT here Dr. Ramadan for endoscopy. A skull MRI was obtained which was concerning for left nasal cavity mass. He is scheduled to undergo mass biopsy on 10/21/13 and was referred to [place] and oculoplastics. Reports has continued to have some sinus pressure; denies HAs, changes in vision, taste, smell, hearing. Says his eyes have been watery and "blurry". Reports some nasal drainage: intermittent, yellowish. Reports some nighttime chills and sweats; denies fever, weight loss. Remainder of ROS as detailed below.    ALLERGIES:  Allergies   Allergen Reactions    No Known Allergies      CURRENT MEDICATIONS:  Current Outpatient Prescriptions   Medication Sig    amLODIPine-benazepril (LOTREL) 5-20 mg Oral Capsule Take 1 Cap by mouth Once a day    aspirin (ECOTRIN) 81 mg Oral Tablet, Delayed Release (E.C.) Take 81 mg by mouth Once a day    Azelastine 137 mcg Nasal Aerosol, Spray 2 Sprays by Nasal route Twice daily Use in each nostril as directed    beclomethasone dipropionate (QNASL) 80 mcg/actuation Nasal HFA Aerosol Inhaler  160 mcg by Nasal route Once a day    fluticasone (FLONASE) 50 mcg/actuation Nasal Spray, Suspension 1 Spray by Each Nostril route Once a day     SOCIAL HISTORY  History     Social History    Marital Status: Married     Spouse Name: N/A     Number of Children: 3    Years of Education: 14     Occupational History    self employed       Social History Main Topics    Smoking status: Never Smoker     Smokeless tobacco: Never Used    Alcohol Use: Yes      Comment: 1 beer every 3 months    Drug Use: No    Sexual Activity: Not on file     Other Topics Concern    Ability To Walk 1 Flight Of Steps Without Sob/Cp Yes    Routine Exercise No    Ability To Walk 2 Flight Of Steps Without Sob/Cp Yes    Ability To Do Own Adl's Yes    Other Activity Level Yes     mechanic activities    Uses Cane No    Uses Walker No    Uses Wheelchair No    Right Hand Dominant Yes     Social History Narrative    Denies use of OTC substances (caffeine pills, pseudoephedrine products, dextromethorphan products, and  herbal preparations). Denies past/current misuse of prescription medications (opioids, sedatives, stimulants). Denies marijuana use. Denies past/current use of illicit drugs (cocaine, heroin, methamphetamine, hallucinogens, and inhalants).              PAST MEDICAL and SURGICAL HISTORY:  Past Medical History   Diagnosis Date    HTN (hypertension)     Asthma      well controlled without medications    Upper respiratory infection      sinusitis 2 weeks ago - cleared up    Hypertension     Palpitations      with anxiety    Ringing in ears     Blurred vision     Obesity     Sinusitis, chronic     Tattoos      x1 left upper arm     Past Surgical History   Procedure Laterality Date    Hx lymph node dissection      Hx eye surgery       eyelid lift - 2013    Hx tonsillectomy      Hx adenoidectomy      Hx colonoscopy       FAMILY HISTORY:  Family History   Problem Relation Age of Onset    HTN <20 y.o.      Heart  Disease      Cancer Father      unknown    Other Mother      early Parkinson's    No Known Problems Brother     No Known Problems Son     No Known Problems Daughter     No Known Problems Son      ROS:  General: reports up to date with preventive health screenings; denies recent weight change, fever, chills; denies recent hospitalizations, ED visits  Skin: denies rashes, suspicious lesions  HEENT: denies head injury, changes in vision, changes in hearing, nasal or ear discharge  Neck: denies lumps, pain  Respiratory: denies dyspnea, cough  CV: denies chest pain, palpitations, shortness of breath, edema  GI: denies difficulty swallowing, stool incontinence, abd pain, n/v/changes in bowel habits  GU: denies incontinence or retention, dysuria or hematuria  Musculoskeletal: denies muscle or joint pain, recent history of trauma, falls, injuries  Neurologic: denies changes in mood, attention or speech; denies changes in orientation, memory, insight, or judgement; denies dizziness, fainting, seizures, paralysis, tremors or involuntary movements. Denies TIA or stroke-like symptoms, unilateral numbness or weakness  All others: negative    PHYSICAL EXAM:  Vital Signs:  BP 144/84   Pulse 88   Temp(Src) 36.1 C (97 F)   Ht 1.82 m (5' 11.65")   Wt 125.6 kg (276 lb 14.4 oz)   BMI 37.92 kg/m2   SpO2 97%  General: appears in good health, stated age, no apparent distress  Mental Status: alert, awake, aware of self and environment, cooperative. Speech fluent; words clear. Oriented to person, place, time. Detailed cognitive testing deferred.  HEENT: skull normocephalic. PERRL. Optic discs: difficult to visualize, no obvious abnormalities.Tongue midline. Neck supple; trachea midline. No lymphadenopathy.   Thorax and Lungs: chest symmetric with good expansion; respirations regular, even, unlabored at rest. No adventitious breath sounds detected.   Cardiovascular: RRR, S1, S2 normal, no m/r/g  Peripheral vascular system:  extremities warm, no edema. Distal pulses 2+ and equal bil. Cap refill <2secs.  GI: abdomen soft, non distended, BS present in all 4 quads, normoactive  Musculoskeletal: No evidence of swelling or deformity. Posture  erect. ROMs good in all joints  Musculoskeletal tenderness: neg  Examination of the back: normal  Neurologic:  Cranial Nerves: I not tested; Ball through XII normal  Motor: good muscle bulk and tone, no spasticity or rigidity, no atrophy or fasciculations. Strength 5/5 throughout. Hand grips and foot pushes strong and equal bil. No involuntary movements observed.   Cerebellar: rapid alternating movements, finger-to-nose, heel-to-shin intact. Gait with normal base. Romberg: maintains balance with eyes closed. No pronator drift.   Sensory: light touch, pain, joint position intact.   DTRs: 2+ and symmetric. Flexor plantar response bil. Negative Babinski bil.  Ankle clonus:Left : Not present Right Not present  Psych: congruent mood and affect, communicative, thought process coherent and insight is good.   Skin: warm, dry.     DATA REVIEWED:  Images reviewed by co-signing physician:   MRI skull base performed 10/04/2013 on [place]. Final report reviewed and indicates   Left nasal cavity mass. Differential diagnosis includes neoplasm such as squamous cell carcinoma, adenoid cystic carcinoma, minor salivary gland tumor. The enhancement is not as intense as would be expected with esthesioneuroblastoma.     ASSESSMENT and PLAN    ICD-9-CM    1. Nasal mass 784.2 AMB CONSULT/REFERRAL NEUROSURGERY     The impression, recommendations and plan of care from today's visit were reviewed, explained and discussed in detail with Jason Ball while in the office. All questions were answered and no further concerns were expressed. Jason Ball is in agreement with the current plan of care as follows:     -f/u on biopsy results (scheduled for 10/21/12 per ENT)     -Return to neurosurgery clinic after sx per protocol or  sooner if worsening/new concerning symptoms. Consented today for crani for tumor removal transbasal approach.     -f/u with Dr. Alfonse Spruce in [place] as scheduled for today     -Follow up testing ordered: None.    -Other services ordered:  None.    Janae Sauce, APRN 10/15/2013, 11:19 AM    A shared visit was performed with the co-signing physician.       I personally saw and examined the patient. See mid-level's note for additional details. My findings are as follows:    Pt with complaints of nasal mass  Exam:  Patient was examined  Cr n 2,3,4 and 6 are intact with no vision issues  Cr n 5,7 intact  Cr n 8,9,10,11, and 12 intact  Good strength in all major motor groups of upper and lower extremity  No cerebellar signs  Higher neurologic functions including speech, memory and thought are intact  No pathological reflexes    Except as noted below:    Pt's imaged were reviewed,  Important findings include   Large mass, bx pending  Assessment:   mass  Plan:   Likely to need transbasal  Follow up in after bx    Jamal Maes, MD PHD 10/16/2013, 11:03 AM

## 2013-10-21 ENCOUNTER — Other Ambulatory Visit (HOSPITAL_COMMUNITY): Payer: Self-pay | Admitting: Otolaryngology

## 2013-10-21 ENCOUNTER — Ambulatory Visit (HOSPITAL_BASED_OUTPATIENT_CLINIC_OR_DEPARTMENT_OTHER): Payer: BC Managed Care – PPO | Admitting: Otolaryngology

## 2013-10-21 ENCOUNTER — Inpatient Hospital Stay
Admission: RE | Admit: 2013-10-21 | Discharge: 2013-10-21 | Disposition: A | Discharge status: Home / Self Care | Payer: BC Managed Care – PPO | Source: Ambulatory Visit | Attending: Otolaryngology | Admitting: Otolaryngology

## 2013-10-21 ENCOUNTER — Encounter (HOSPITAL_COMMUNITY): Payer: Self-pay

## 2013-10-21 ENCOUNTER — Ambulatory Visit (HOSPITAL_BASED_OUTPATIENT_CLINIC_OR_DEPARTMENT_OTHER): Payer: BC Managed Care – PPO | Admitting: Certified Registered"

## 2013-10-21 ENCOUNTER — Encounter (HOSPITAL_COMMUNITY): Payer: Self-pay | Admitting: Family

## 2013-10-21 ENCOUNTER — Other Ambulatory Visit (HOSPITAL_COMMUNITY): Payer: Self-pay

## 2013-10-21 ENCOUNTER — Encounter (HOSPITAL_COMMUNITY)
Admission: RE | Disposition: A | Discharge status: Home / Self Care | Payer: Self-pay | Source: Ambulatory Visit | Attending: Otolaryngology

## 2013-10-21 DIAGNOSIS — C8589 Other specified types of non-Hodgkin lymphoma, extranodal and solid organ sites: Secondary | ICD-10-CM

## 2013-10-21 DIAGNOSIS — J45909 Unspecified asthma, uncomplicated: Secondary | ICD-10-CM | POA: Insufficient documentation

## 2013-10-21 DIAGNOSIS — D491 Neoplasm of unspecified behavior of respiratory system: Secondary | ICD-10-CM | POA: Insufficient documentation

## 2013-10-21 DIAGNOSIS — J338 Other polyp of sinus: Secondary | ICD-10-CM | POA: Insufficient documentation

## 2013-10-21 DIAGNOSIS — I10 Essential (primary) hypertension: Secondary | ICD-10-CM | POA: Insufficient documentation

## 2013-10-21 DIAGNOSIS — J329 Chronic sinusitis, unspecified: Secondary | ICD-10-CM | POA: Insufficient documentation

## 2013-10-21 SURGERY — BIOPSY  NASAL
Anesthesia: General | Laterality: Left | Wound class: Clean Contaminated Wounds-The respiratory, GI, Genital, or urinary

## 2013-10-21 MED ORDER — NEOMYCIN-POLYMYXIN-PRAMOXINE 3.5 MG-10,000 UNIT-10 MG/GRAM TOP CREAM
TOPICAL_CREAM | Freq: Once | CUTANEOUS | Status: DC | PRN
Start: 2013-10-21 — End: 2013-10-21
  Filled 2013-10-21: qty 15

## 2013-10-21 MED ORDER — OXYMETAZOLINE 0.05 % NASAL SPRAY
30.00 mL | Freq: Once | NASAL | Status: DC | PRN
Start: 2013-10-21 — End: 2013-10-21
  Administered 2013-10-21: 30 mL via TOPICAL
  Filled 2013-10-21: qty 30

## 2013-10-21 MED ORDER — MIDAZOLAM 1 MG/ML INJECTION SOLUTION
Freq: Once | INTRAMUSCULAR | Status: DC | PRN
Start: 2013-10-21 — End: 2013-10-21
  Administered 2013-10-21: 2 mg via INTRAVENOUS

## 2013-10-21 MED ORDER — ONDANSETRON HCL (PF) 4 MG/2 ML INJECTION SOLUTION
Freq: Once | INTRAMUSCULAR | Status: DC | PRN
Start: 2013-10-21 — End: 2013-10-21
  Administered 2013-10-21: 4 mg via INTRAVENOUS

## 2013-10-21 MED ORDER — ONDANSETRON HCL (PF) 4 MG/2 ML INJECTION SOLUTION
4.00 mg | Freq: Once | INTRAMUSCULAR | Status: DC | PRN
Start: 2013-10-21 — End: 2013-10-21
  Filled 2013-10-21: qty 2

## 2013-10-21 MED ORDER — SODIUM CHLORIDE 0.9 % (FLUSH) INJECTION SYRINGE
2.00 mL | INJECTION | INTRAMUSCULAR | Status: DC | PRN
Start: 2013-10-21 — End: 2013-10-21

## 2013-10-21 MED ORDER — LACTATED RINGERS INTRAVENOUS SOLUTION
INTRAVENOUS | Status: DC
Start: 2013-10-21 — End: 2013-10-21
  Administered 2013-10-21: 0 via INTRAVENOUS

## 2013-10-21 MED ORDER — LIDOCAINE (PF) 100 MG/5 ML (2 %) INTRAVENOUS SYRINGE
INJECTION | Freq: Once | INTRAVENOUS | Status: DC | PRN
Start: 2013-10-21 — End: 2013-10-21
  Administered 2013-10-21: 100 mg via INTRAVENOUS

## 2013-10-21 MED ORDER — SODIUM CHLORIDE 0.9 % (FLUSH) INJECTION SYRINGE
2.00 mL | INJECTION | Freq: Three times a day (TID) | INTRAMUSCULAR | Status: DC
Start: 2013-10-21 — End: 2013-10-21

## 2013-10-21 MED ORDER — FENTANYL (PF) 50 MCG/ML INJECTION SOLUTION
Freq: Once | INTRAMUSCULAR | Status: DC | PRN
Start: 2013-10-21 — End: 2013-10-21
  Administered 2013-10-21: 100 ug via INTRAVENOUS

## 2013-10-21 MED ORDER — SUCCINYLCHOLINE CHLORIDE 200 MG/10 ML (20 MG/ML) INTRAVENOUS SYRINGE
INJECTION | Freq: Once | INTRAVENOUS | Status: DC | PRN
Start: 2013-10-21 — End: 2013-10-21
  Administered 2013-10-21: 200 mg via INTRAVENOUS

## 2013-10-21 MED ORDER — AMOXICILLIN 875 MG TABLET
875.00 mg | ORAL_TABLET | Freq: Two times a day (BID) | ORAL | Status: AC
Start: 2013-10-21 — End: 2013-10-28

## 2013-10-21 MED ORDER — ACETAMINOPHEN 1,000 MG/100 ML (10 MG/ML) INTRAVENOUS SOLUTION
Freq: Once | INTRAVENOUS | Status: DC | PRN
Start: 2013-10-21 — End: 2013-10-21
  Administered 2013-10-21 (×2): 1000 mg via INTRAVENOUS

## 2013-10-21 MED ORDER — DEXAMETHASONE SODIUM PHOSPHATE 4 MG/ML INJECTION SOLUTION
Freq: Once | INTRAMUSCULAR | Status: DC | PRN
Start: 2013-10-21 — End: 2013-10-21
  Administered 2013-10-21: 10 mg via INTRAVENOUS

## 2013-10-21 MED ORDER — HYDROCODONE 5 MG-ACETAMINOPHEN 325 MG TABLET
1.00 | ORAL_TABLET | ORAL | Status: DC | PRN
Start: 2013-10-21 — End: 2013-11-06

## 2013-10-21 MED ORDER — GELATIN MATRIX SEALANT (FLOSEAL) 10 ML KIT
PACK | Freq: Once | CUTANEOUS | Status: DC | PRN
Start: 2013-10-21 — End: 2013-10-21
  Administered 2013-10-21: 10 mL via TOPICAL

## 2013-10-21 MED ORDER — PROPOFOL 10 MG/ML IV BOLUS
INJECTION | Freq: Once | INTRAVENOUS | Status: DC | PRN
Start: 2013-10-21 — End: 2013-10-21
  Administered 2013-10-21: 200 mg via INTRAVENOUS

## 2013-10-21 MED ORDER — LIDOCAINE 1 %-EPINEPHRINE 1:100,000 INJECTION SOLUTION
5.00 mL | Freq: Once | INTRAMUSCULAR | Status: DC | PRN
Start: 2013-10-21 — End: 2013-10-21
  Administered 2013-10-21 (×2): 12 mL via INTRAMUSCULAR

## 2013-10-21 MED ORDER — OXYCODONE-ACETAMINOPHEN 5 MG-325 MG TABLET
1.0000 | ORAL_TABLET | ORAL | Status: DC | PRN
Start: 2013-10-21 — End: 2013-10-21
  Filled 2013-10-21: qty 1

## 2013-10-21 MED ORDER — HYDROMORPHONE 1 MG/ML INJECTION WRAPPER
0.60 mg | INJECTION | INTRAMUSCULAR | Status: DC | PRN
Start: 2013-10-21 — End: 2013-10-21
  Filled 2013-10-21: qty 1

## 2013-10-21 MED ORDER — ROCURONIUM 10 MG/ML INTRAVENOUS SOLUTION
Freq: Once | INTRAVENOUS | Status: DC | PRN
Start: 2013-10-21 — End: 2013-10-21
  Administered 2013-10-21: 5 mg via INTRAVENOUS

## 2013-10-21 MED ADMIN — acetaminophen 325 mg tablet: INTRAMUSCULAR | @ 13:00:00

## 2013-10-21 MED ADMIN — potassium chloride 20 mEq/L in dextrose 5 %-0.45 % sodium chloride IV: INTRAVENOUS | @ 13:00:00 | NDC 00338067104

## 2013-10-21 SURGICAL SUPPLY — 16 items
BLANKET 3M BAIR HUG ADLT LWR B ODY 60X36IN PLMR AIR SYS LTWT (MISCELLANEOUS PT CARE ITEMS) ×2 IMPLANT
CONV USE ITEM 338652 - PACK SURG SIN NONST DISP LF (CUSTOM TRAYS & PACK) ×1 IMPLANT
DISCONTINUED USE ITEM 35894 - DRAPE MAG 16X10IN DVN FOAM FLXB 121 STRL LF  DISP (EQUIPMENT MINOR) ×1 IMPLANT
DONUT EXTREMITY CUSHIONING 31143137 (POSITIONING PRODUCTS) ×2 IMPLANT
DRAPE MAG 16X10IN DVN FOAM FLX_B 121 STRL LF DISP (EQUIPMENT MINOR) ×1
GARMENT COMPRESS MED CALF CENTAURA NYL VASOGRAD LTWT BRTHBL SEQ FIL BLU 18- IN (ORTHOPEDICS (NOT IMPLANTS)) ×1 IMPLANT
GARMENT COMPRESS MED CALF CENT_AURA NYL VASOGRAD LTWT BRTHBL (ORTHOPEDICS (NOT IMPLANTS)) ×1
KIT RM TURNOVER CLEANOP CSTM INFCT CONTROL (KITS & TRAYS (DISPOSABLE)) ×1
KIT RM TURNOVER CLEANOP CSTM I_NFCT CONTROL (KITS & TRAYS (DISPOSABLE)) ×1
KIT RM TURNOVER CLEANOP CUSTOM INFCT CONTROL (KITS & TRAYS (DISPOSABLE)) ×1 IMPLANT
LABEL E-Z STICK_STLEZP1 100EA/CS (LABELS/CHART SUPPLIES) ×1
LABEL MED EZ PEEL MRKR LF (LABELS/CHART SUPPLIES) ×1 IMPLANT
PACK CUSTOM SINUS CS/1 (CUSTOM TRAYS & PACK) ×2
PACK SURG SIN NONST DISP LF (CUSTOM TRAYS & PACK) ×1
PAD ARMBOARD FOAM BLU_FP-ECARM (POSITIONING PRODUCTS) ×2
PAD ARMBRD BLU (POSITIONING PRODUCTS) ×2 IMPLANT

## 2013-10-21 NOTE — OR PostOp (Signed)
Prescription X 2  in plastic sleeve in front of chart.

## 2013-10-21 NOTE — Anesthesia Transfer of Care (Signed)
ANESTHESIA TRANSFER OF CARE NOTE        Anesthesia Service      [facility]         Last Vitals: Temperature: 36.4 C (97.5 F) (10/21/13 1402)  Heart Rate: 94 (10/21/13 1402)  BP (Non-Invasive): 161/96 mmHg (10/21/13 1402)  Respiratory Rate: 16 (10/21/13 1402)  SpO2-1: 98 % (10/21/13 1402)  Pain Score (Numeric, Faces): 2 (10/21/13 1238)    Patient transferred to PACU in stable condition. Report given to RN.    2/2/2015at 14:02.

## 2013-10-21 NOTE — H&P (Addendum)
[facility]  OTOLARYNGOLOGY DEPARTMENT  H&P Update    Date: 10/21/2013  Name: Alp Goldwater, 53 y.o. male  MRN: [id number]  DOB:       STOP: IF H&P IS GREATER THAN 30 DAYS FROM SURGICAL DAY COMPLETE NEW H&P IS REQUIRED.    Pre-Surgical H & P updated the day of the procedure.  1.  H&P the patient has been examined, and no change has occured in the patients condition since the H&P was completed.       Change in medications: No     2.  Patient continues to be appropiate candidate for planned surgical procedure. YES    Chadi A. Wilder Glade, MD  PGY-5 resident  Department of Otolaryngology, [place]      I saw and examined the patient.  I reviewed the resident's note.  I agree with the findings and plan of care as documented in the resident's note.  Any exceptions/additions are edited/noted.    Barbette Merino Natarsha Hurwitz, MD 10/21/2013, 13:25

## 2013-10-21 NOTE — Discharge Instructions (Signed)
SURGICAL DISCHARGE INSTRUCTIONS     Dr. Ramadan, Barbette Merino, MD  performed your BIOPSY  NASAL today at the [place]:  Monday through Friday from 6 a.m. - 7 p.m.: [phone]  Between 7 p.m. - 6 a.m., weekends and holidays:  Call [facility] at [phone] or [phone].    PLEASE SEE WRITTEN HANDOUTS AS DISCUSSED BY YOUR NURSE:      SIGNS AND SYMPTOMS OF A WOUND / INCISION INFECTION   Be sure to watch for the following:   Increase in redness or red streaks near or around the wound or incision.   Increase in pain that is intense or severe and cannot be relieved by the pain medication that your doctor has given you.   Increase in swelling that cannot be relieved by elevation of a body part, or by applying ice, if permitted.   Increase in drainage, or if yellow / green in color and smells bad. This could be on a dressing or a cast.   Increase in fever for longer than 24 hours, or an increase that is higher than 101 degrees Fahrenheit (normal body temperature is 98 degrees Fahrenheit). The incision may feel warm to the touch.    **CALL YOUR DOCTOR IF ONE OR MORE OF THESE SIGNS / SYMPTOMS SHOULD OCCUR.    ANESTHESIA INFORMATION   ANESTHESIA -- ADULT PATIENTS:  You have received intravenous sedation / general anesthesia, and you may feel drowsy and light-headed for several hours. You may even experience some forgetfulness of the procedure. DO NOT DRIVE A MOTOR VEHICLE or perform any activity requiring complete alertness or coordination until you feel fully awake in about 24-48 hours. Do not drink alcoholic beverages for at least 24 hours. Do not stay alone, you must have a responsible adult available to be with you. You may also experience a dry mouth or nausea for 24 hours. This is a normal side effect and will disappear as the effects of the medication wear off.    REMEMBER   If you experience any difficulty breathing, chest pain, bleeding that you feel is excessive,  persistent nausea or vomiting or for any other concerns:  Call your physician Dr. Ramadan at [phone] or [phone]. You may also ask to have the ENT doctor on call paged. They are available to you 24 hours a day.    SPECIAL INSTRUCTIONS / COMMENTS       FOLLOW-UP APPOINTMENTS   Please call patient services at [phone] or [phone] to schedule a date / time of return. They are open Monday - Friday from 7:30 am - 5:00 pm.

## 2013-10-21 NOTE — OR Surgeon (Signed)
[facility]   OTOLARYNGOLOGY DEPARTMENT   BRIEF OPERATIVE NOTE    Name: Jason Ball, 53 y.o. male  MRN: [id number]  DOB:   Date of Surgery: 10/21/2013    Preoperative Diagnoses: left sinonasal mass  Postoperative Diagnoses: same   Procedure: endoscopic left uncinectomy and anterior ethmoidectomy with mass biopsy   Surgeons: Dr. 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 Wilder Glade, MD  PGY-5 resident  Department of Otolaryngology, [facility]

## 2013-10-21 NOTE — OR Surgeon (Addendum)
[facility] OF OTOLARYNGOLOGY                                     OPERATION SUMMARY    PATIENT NAME: Jason, Ball ASA II  HOSPITAL [phone]  DATE OF SERVICE:10/21/2013  DATE OF BIRTH:     PREOPERATIVE DIAGNOSIS:  Left sinonasal mass.    POSTOPERATIVE DIAGNOSIS:  Same     NAME OF PROCEDURE:  BrainLAB-assisted endoscopic left uncinectomy and anterior ethmoidectomy with biopsy of sinonasal tumor.    SURGEONS:  Jason Merino Donaciano Range MD (attending), Jason Blaze MD (resident).    OPERATIVE FINDINGS:  1.  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  2.  Polypoid polyp mass from the medial side of the middle turbinate.    DESCRIPTION OF PROCEDURE:  After informed consent was checked in the preoperative holding area, the patient was taken to the OR.  A surgical pause was successfully performed.  General anesthesia was induced via endotracheal tube.  The head of the bed was turned 90 degrees.  Both nasal cavities were packed with pledgets soaked with Afrin and the septal mucosa was injected with 1% lidocaine, 1:100,000 epinephrine.  The BrainLAB machine was then brought in and set up and successfully calibrated.  The face was then prepped and draped in the usual fashion for sinus surgery.  Using the 0-degree endoscope, both nasal cavities were examined.  There was a bulge on the left side filling the middle meatus above the superior attachment of the middle turbinate.  On the right side toward the cribriform area superiorly, there was a shiny mass that seemed to be extending from the left side as well.  On the left side, also the middle turbinate had the polypoid tumor from the medial.  Did not seem to be associated with a main sinonasal tumor.  Injection was then performed in the uncinate process, middle turbinate and lateral nasal wall.  The middle turbinate was then medialized, and the uncinate process was further  removed using seeker and upbiting forceps.  That gave [place] access to the tumor, which was friable but not very bloody.  A curet was then used to scoop the tumor and sinuses from the anterior ethmoid sinuses up to the skull base.  Upbiting forceps were used to pick all the mass tissues and sent for biopsy.  We also removed a polyp from the left middle turbinate and sent it as a separate biopsy specimen.  Bleeding was controlled with pledgets soaked with Afrin and then FloSeal was used for further hemostasis.  The patient was then given back to anesthesia and allowed to be awakened and extubated without any difficulty.  The patient was then transferred to the PACU in stable condition.  Dr. Elia Nunley was present during key and critical portions of the procedure and immediately available at all times.    DISPOSITION:  The patient will be discharged home when discharge criteria are met.      Jason Blaze, MD  Resident  [facility] Department of Otolaryngology    Jason Hail Heinrich Fertig, MD  Professor and [facility] Department of Otolaryngology    [id number]; D: 10/21/2013 13:56:50; T: 10/21/2013 19:22:14  I was present for all key and/or critical portions of the case and immediately available at all times.      Jason Merino Kamaryn Grimley, MD 10/22/2013, 13:30

## 2013-10-21 NOTE — OR PostOp (Signed)
Reviewed allergies and history with CRNA.

## 2013-10-21 NOTE — OR PreOp (Signed)
Received into area D for sinus surgery.  ID and NPO status verified.  Abdomen soft, non-tender.  Procedure explained, states understanding. Wife at bedside.

## 2013-10-22 ENCOUNTER — Ambulatory Visit (INDEPENDENT_AMBULATORY_CARE_PROVIDER_SITE_OTHER): Payer: Self-pay | Admitting: Otolaryngology

## 2013-10-22 NOTE — Telephone Encounter (Signed)
Advised that this is normal and will improve. Patient will call back if there are any questions or concerns.

## 2013-10-22 NOTE — Anesthesia Postprocedure Evaluation (Signed)
ANESTHESIA POSTOP EVALUATION NOTE        Anesthesia Service      [facility]     10/22/2013     Last Vitals: Temperature: 36.7 C (98.1 F) (10/21/13 1505)  Heart Rate: 87 (10/21/13 1505)  BP (Non-Invasive): 152/90 mmHg (10/21/13 1505)  Respiratory Rate: 20 (10/21/13 1505)  SpO2-1: 96 % (10/21/13 1505)  Pain Score (Numeric, Faces): 2 (10/21/13 1505)    Procedure(s):  BIOPSY  NASAL    Patient is sufficiently recovered from the effects of anesthesia to participate in the evaluation and has returned to their pre-procedure level.  I have reviewed and evaluated the following:  Respiratory Function: Consistent with pre anesthetic level  Cardiovascular Function: Consistent with pre anesthetic level  Mental Status: Return to pre anesthetic baseline level  Pain: Sufficiently controlled with medication  Nausea and Vomiting: Absent or sufficiently controlled with medication  Post-op Anesthetic Complications: None    Comment/ re-evaluation for any variations: None

## 2013-10-22 NOTE — Telephone Encounter (Signed)
Message copied by Theodoro Clock on Tue Oct 22, 2013  1:47 PM  ------       Message from: Robyne Askew A       Created: Tue Oct 22, 2013 11:38 AM         >> Tommi Rumps 10/22/2013 11:38 AM       Ramadan pt               Pt is calling to let Dr Ramadan know there is no drainage through is nose but when he cough's up stuff there is a little blood. He states he is doing well              Thank you       Helene Kelp   ------

## 2013-10-24 LAB — HISTORICAL SURGICAL PATHOLOGY SPECIMEN

## 2013-10-29 ENCOUNTER — Encounter (INDEPENDENT_AMBULATORY_CARE_PROVIDER_SITE_OTHER): Payer: Self-pay | Admitting: Neurological Surgery

## 2013-10-29 ENCOUNTER — Ambulatory Visit (INDEPENDENT_AMBULATORY_CARE_PROVIDER_SITE_OTHER): Payer: Self-pay | Admitting: Otolaryngology

## 2013-10-29 ENCOUNTER — Ambulatory Visit: Payer: BC Managed Care – PPO | Attending: Neurological Surgery | Admitting: Neurological Surgery

## 2013-10-29 VITALS — BP 150/94 | HR 99 | Temp 98.0°F | Ht 71.61 in | Wt 267.2 lb

## 2013-10-29 DIAGNOSIS — C8589 Other specified types of non-Hodgkin lymphoma, extranodal and solid organ sites: Secondary | ICD-10-CM

## 2013-10-29 DIAGNOSIS — C8298 Follicular lymphoma, unspecified, lymph nodes of multiple sites: Secondary | ICD-10-CM | POA: Insufficient documentation

## 2013-10-29 DIAGNOSIS — C8590 Non-Hodgkin lymphoma, unspecified, unspecified site: Secondary | ICD-10-CM

## 2013-10-29 NOTE — H&P (Deleted)
[facility] of Neurosurgery  Return Outpatient Note    Jason Ball  DOB:   Gender: male  Date of Service: 10/29/2013  Referring physician: Ramadan, Barbette Merino, MD  [address]  [address] [place], [place] [address]  Primary Care Provider: Dolores Lory, DO    CHIEF COMPLAINT: ***    Reliable source of information: {HISTORY PROVIDED BY:19225::"patient"} and EMR review.    HISTORY OF PRESENT ILLNESS:      ALLERGIES:  Allergies   Allergen Reactions    No Known Allergies      CURRENT MEDICATIONS:  Current Outpatient Prescriptions   Medication Sig    amLODIPine-benazepril (LOTREL) 5-20 mg Oral Capsule Take 1 Cap by mouth Once a day    aspirin (ECOTRIN) 81 mg Oral Tablet, Delayed Release (E.C.) Take 81 mg by mouth Once a day    beclomethasone dipropionate (QNASL) 80 mcg/actuation Nasal HFA Aerosol Inhaler 160 mcg by Nasal route Once a day    HYDROcodone-acetaminophen (NORCO) 5-325 mg Oral Tablet Take 1-2 Tabs by mouth Every 4 hours as needed for Pain     SOCIAL HISTORY  History     Social History    Marital Status: Married     Spouse Name: N/A     Number of Children: 3    Years of Education: 58     Occupational History    self employed       Social History Main Topics    Smoking status: Never Smoker     Smokeless tobacco: Never Used    Alcohol Use: Yes      Comment: 1 beer every 3 months    Drug Use: No    Sexual Activity: Not on file     Other Topics Concern    Ability To Walk 1 Flight Of Steps Without Sob/Cp Yes    Routine Exercise No    Ability To Walk 2 Flight Of Steps Without Sob/Cp Yes    Ability To Do Own Adl's Yes    Other Activity Level Yes     mechanic activities    Uses Cane No    Uses Walker No    Uses Wheelchair No    Right Hand Dominant Yes     Social History Narrative    Denies use of OTC substances (caffeine pills, pseudoephedrine products, dextromethorphan products, and herbal preparations). Denies past/current misuse of prescription medications (opioids, sedatives,  stimulants). Denies marijuana use. Denies past/current use of illicit drugs (cocaine, heroin, methamphetamine, hallucinogens, and inhalants).              ROS:  General: denies recent weight change, fever, chills; denies recent hospitalizations, ED visits;  Skin: denies rashes, suspicious lesions  HEENT: denies head injury, changes in vision, changes in hearing, nasal or ear discharge  Neck: denies lumps  Respiratory: denies dyspnea, cough  CV: denies chest pain, palpitations, sob  GI: denies difficulty swallowing, stool incontinence, n/v/abd pain  GU: denies incontinence or retention  Musculoskeletal: denies muscle or joint pain, history of trauma  Neurologic: denies changes in mood, attention or speech; denies changes in orientation, memory, insight, or judgement; denies dizziness, fainting, seizures, paralysis, tremors or involuntary movements, or numbness. Denies TIA or stroke-like symptoms, unilateral numbness or weakness  Endocrine: denies heat/cold intolerance, excessive sweating, excessive thirst or hunger, polyuria, change in glove or shoe size; denies galactorrhea ***    PHYSICAL EXAM:  Vital Signs:  BP 150/94   Pulse 99   Temp(Src) 36.7  C (98 F) (Tympanic)   Ht 1.819 m (5' 11.61")   Wt 121.2 kg (267 lb 3.2 oz)   BMI 36.63 kg/m2   SpO2 98%  General: appears in good health, stated age, no apparent distress  Mental Status: alert, awake, aware of self and environment, cooperative. Speech fluent; words clear. Oriented to person, place, time. Detailed cognitive testing deferred.  HEENT: skull normocephalic. PERRL. VFT by confrontation WNL. Optic discs: not able to visualize.Tongue midline. Neck supple; trachea midline. No lymphadenopathy.   Thorax and Lungs: chest symmetric with good expansion; respirations regular, even, unlabored at rest. No adventitious breath sounds detected.   Cardiovascular: RRR, S1, S2 normal, no m/r/g  Peripheral vascular system: extremities warm, no edema. Distal pulses 2+ and equal  bil. Cap refill <2secs.  Musculoskeletal: No evidence of swelling or deformity. Posture erect.  Neurologic:  Cranial Nerves: I not tested; Ball through XII {abnormal cranial nerves:30353::"normal"}  Motor: good muscle bulk and tone, no spasticity or rigidity, no atrophy or fasciculations. Strength 5/5 throughout.No involuntary movements.   Cerebellar: rapid alternating movements, finger-to-nose, heel-to-shin intact. Gait with normal base. Romberg: maintains balance with eyes closed. No pronator drift.   Sensory: light touch, pain, joint position intact.   DTRs: 2+ and symmetric. Flexor plantar response bil. Negative Babinski bil.  Ankle clonus:Left : {ANKLE CLONUS:20024204} Right {ANKLE CLONUS:20024204}  Psych: congruent mood and affect, communicative, thought process coherent and insight is good.   Skin: warm, dry. Incision with edges well-approximated. No s/s infection. ***    DATA REVIEWED:  Images reviewed by co-signing physician:   {DATAWVU IMAGES:21906} performed {Dates:24032} on {DATAWVU IMAGE SOURCE:24200}. Final report reviewed and indicates ***    {DATAWVU IMAGES:21906} performed {Dates:24032} on {DATAWVU IMAGE SOURCE:24200}. Final report reviewed and indicates ***    ASSESSMENT and PLAN:    ICD-9-CM    1. Lymphoma 202.80 AMB CONSULT/REFERRAL HEM/ONC ([facility])     AMB CONSULT/REFERRAL ENT-POC     -Return to neurosurgery clinic in *** or sooner if worsening/new concerning symptoms.   -Follow up testing ordered: {OCC TESTING ORDERED (AMB):[phone]}.    -Other services ordered:  {OCC OTHER SERVICES (AMB):[phone]}.    Janae Sauce, APRN 10/29/2013, 11:33    A shared visit was performed with the co-signing physician.

## 2013-10-29 NOTE — Telephone Encounter (Signed)
Patient contacted, made aware that it is out of this nurse's scope of practice to give biopsy results and Dr. Ramadan is not in clinic today to go over results. Patient advised to keep appointment today with Dr. Constance Holster at 10:30. Verbalized understanding.

## 2013-10-29 NOTE — Progress Notes (Addendum)
[facility] Department of Neurosurgery  Return Outpatient Note    Jason Ball Ball  DOB:   Gender: male  Date of Service: 10/29/2013  Referring physician: Ramadan, Barbette Merino, MD  [address]  [address] [place]  [place], [place] [address]  Primary Care Provider: Dolores Lory, DO    CHIEF COMPLAINT: left nasal mass, lymphoma    Reliable source of information: patient and EMR review.    HISTORY OF PRESENT ILLNESS:  Jason Ball is a 53 y.o. male who presents for left nasal cavity mass discovered during w/u for chronic sinusitis and cervical lymphadenopathy. Last seen 10/15/13: impression was left nasal cavity mass. He underwent biopsy on 10/21/13 per Dr. 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 He says he hasn't been able to see Dr. Ramadan to discuss the bx results. He was also evaluated by oculoplastics, Dr. Alfonse Spruce. Reports has continued to have some sinus pressure; denies HAs, changes in vision, taste, smell, hearing. Denies changes in vision. Reports some nasal drainage: intermittent, yellowish, he is currently on amoxicillin. Reports some nighttime chills and sweats; denies fever, weight loss. Remainder of ROS as detailed below.  ALLERGIES:  Allergies   Allergen Reactions    No Known Allergies      CURRENT MEDICATIONS:  Current Outpatient Prescriptions   Medication Sig    amLODIPine-benazepril (LOTREL) 5-20 mg Oral Capsule Take 1 Cap by mouth Once a day    aspirin (ECOTRIN) 81 mg Oral Tablet, Delayed Release (E.C.) Take 81 mg by mouth Once a day    beclomethasone dipropionate (QNASL) 80 mcg/actuation Nasal HFA Aerosol Inhaler 160 mcg by Nasal route Once a day    HYDROcodone-acetaminophen (NORCO) 5-325 mg Oral Tablet Take 1-2 Tabs by mouth Every 4 hours as needed for Pain     SOCIAL HISTORY  History     Social History    Marital Status: Married     Spouse Name: N/A     Number of Children: 3    Years of Education: 47     Occupational History       self employed       Social History Main Topics    Smoking status: Never Smoker     Smokeless tobacco: Never Used    Alcohol Use: Yes      Comment: 1 beer every 3 months    Drug Use: No    Sexual Activity: Not on file     Other Topics Concern    Ability To Walk 1 Flight Of Steps Without Sob/Cp Yes    Routine Exercise No    Ability To Walk 2 Flight Of Steps Without Sob/Cp Yes    Ability To Do Own Adl's Yes    Other Activity Level Yes     mechanic activities    Uses Cane No    Uses Walker No    Uses Wheelchair No    Right Hand Dominant Yes     Social History Narrative    Denies use of OTC substances (caffeine pills, pseudoephedrine products, dextromethorphan products, and herbal preparations). Denies past/current misuse of prescription medications (opioids, sedatives, stimulants). Denies marijuana use. Denies past/current use of illicit drugs (cocaine, heroin, methamphetamine, hallucinogens, and inhalants).              ROS:  General: denies recent weight change, fever, chills; denies recent hospitalizations, ED visits;  Skin: denies rashes, suspicious lesions  HEENT: denies head injury, changes in vision, changes in  hearing, nasal or ear discharge  Neck: denies lumps  Respiratory: denies dyspnea, cough  CV: denies chest pain, palpitations, sob  GI: denies difficulty swallowing, stool incontinence, n/v/abd pain  GU: denies incontinence or retention  Musculoskeletal: denies muscle or joint pain, history of trauma  Neurologic: denies changes in mood, attention or speech; denies changes in orientation, memory, insight, or judgement; denies dizziness, fainting, seizures, paralysis, tremors or involuntary movements, or numbness. Denies TIA or stroke-like symptoms, unilateral numbness or weakness    PHYSICAL EXAM:  Vital Signs:  BP 150/94   Pulse 99   Temp(Src) 36.7 C (98 F) (Tympanic)   Ht 1.819 m (5' 11.61")   Wt 121.2 kg (267 lb 3.2 oz)   BMI 36.63 kg/m2   SpO2 98%  General: appears in good health,  stated age, no apparent distress  Mental Status: alert, awake, aware of self and environment, cooperative. Speech fluent; words clear. Oriented to person, place, time. Detailed cognitive testing deferred.  HEENT: skull normocephalic. PERRL. Optic discs: not able to visualize.Tongue midline. Neck supple; trachea midline. No lymphadenopathy.   Thorax and Lungs: chest symmetric with good expansion; respirations regular, even, unlabored at rest. No adventitious breath sounds detected.   Cardiovascular: RRR, S1, S2 normal, no m/r/g  Peripheral vascular system: extremities warm, no edema. Distal pulses 2+ and equal bil. Cap refill <2secs.  Musculoskeletal: No evidence of swelling or deformity. Posture erect.  Neurologic:  Cranial Nerves: I not tested; Ball through XII normal  Motor: good muscle bulk and tone, no spasticity or rigidity, no atrophy or fasciculations. Strength 5/5 throughout.No involuntary movements.   Cerebellar: rapid alternating movements, finger-to-nose, heel-to-shin intact. Gait with normal base. Romberg: maintains balance with eyes closed. No pronator drift.   Sensory: light touch, pain, joint position intact.   DTRs: 2+ and symmetric. Flexor plantar response bil. Negative Babinski bil.  Ankle clonus:Left : Not present Right Not present  Psych: congruent mood and affect, communicative, thought process coherent and insight is good.   Skin: warm, dry.    DATA REVIEWED:  Images reviewed by co-signing physician: no recent imaging to review    ASSESSMENT and PLAN:    ICD-9-CM    1. Lymphoma 202.80 AMB CONSULT/REFERRAL HEM/ONC ([facility])     AMB CONSULT/REFERRAL ENT-POC     The impression, recommendations and plan of care from today's visit were reviewed, explained and discussed in detail with Jason Ball while in the office. All questions were answered and no further concerns were expressed. Jason Ball is in agreement with the current plan of care as follows:     -Return to neurosurgery clinic if  worsening/new concerning symptoms.   -Follow up testing ordered: None.      -Other services ordered: Case discussed by Dr. Constance Holster with Dr. Mare Ferrari (Dr. Ramadan is out of town). Referral:  ENT Dr. Ramadan and heme onc. F/u with Dr. Constance Holster prn.     Janae Sauce, APRN 10/29/2013, 13:11    A shared visit was performed with the co-signing physician.      I personally saw and examined the patient. See mid-level's note for additional details. My findings are as follows:    Pt with complaints of sinus mass  Exam:  Patient was examined  Cr n 2,3,4 and 6 are intact with no vision issues  Cr n 5,7 intact  Cr n 8,9,10,11, and 12 intact  Good strength in all major motor groups of upper and lower extremity  No cerebellar signs  Higher neurologic functions including speech, memory and thought are intact  No pathological reflexes    Except as noted below:    Pt's imaged were reviewed,  Important findings include   Sinus mass  Assessment:   bx shows lymphoma  No surg at this time  Plan:   Onc eval  Follow up in   prn  Jamal Maes, MD PHD 10/30/2013, 10:47

## 2013-10-29 NOTE — Telephone Encounter (Signed)
Message copied by Caleen Essex on Tue Oct 29, 2013  8:48 AM  ------       Message from: GAMBLE, STEPHANIE       Created: Tue Oct 29, 2013  8:17 AM         >> STEPHANIE GAMBLE 10/29/2013 08:17 AM       Dr Ramadan                             Pt is calling to get his bx results from 2.2.15 he wants to know if he is to come to his appt with Dr Constance Holster today at 10:30 e needs to know asap since he lives in [place]  please call   ------

## 2013-10-30 ENCOUNTER — Ambulatory Visit (INDEPENDENT_AMBULATORY_CARE_PROVIDER_SITE_OTHER): Payer: Self-pay | Admitting: Otolaryngology

## 2013-10-30 NOTE — Telephone Encounter (Signed)
Message copied by Margaretmary Lombard on Wed Oct 30, 2013  3:50 PM  ------       Message from: Markham Jordan       Created: Wed Oct 30, 2013  3:11 PM         >> Markham Jordan 10/30/2013 03:11 PM       Dr. Ramadan patient would like to speak with you regarding his biopsy results.  Patient states he received results from Dr. Constance Holster, but he has some additional questions.  Patient would like to speak with you ASAP.  Patient can be reached at [phone].  Thank you  ------

## 2013-11-04 ENCOUNTER — Ambulatory Visit (INDEPENDENT_AMBULATORY_CARE_PROVIDER_SITE_OTHER): Payer: Self-pay | Admitting: Otolaryngology

## 2013-11-04 NOTE — Telephone Encounter (Signed)
Message copied by Caleen Essex on Mon Nov 04, 2013 10:38 AM  ------       Message from: Berlin Hun       Created: Mon Nov 04, 2013 10:17 AM         >> Tula Nakayama [id number] 11/04/2013 10:17 AM       Ramadan       PT is asking to speak with Dr Ramadan. He is having some issues with his throat. He said its alittle swollen and is uncomfortable. He is asking if he could get some antibiotics or if he should try something else. He said Dr Ramadan never called him from his previous message. Please call and advise [phone]       Thanks                     >> Markham Jordan 10/30/2013 03:11 PM       Dr. Ramadan patient would like to speak with you regarding his biopsy results.  Patient states he received results from Dr. Constance Holster, but he has some additional questions.  Patient would like to speak with you ASAP.  Patient can be reached at [phone].  Thank you         ------

## 2013-11-04 NOTE — Telephone Encounter (Signed)
Patient is scheduled with heme oncology on the 18th, advised per Dr. Gershon Crane to ask heme onc his questions regarding lymphoma. He's also concerned about "sinus infection" he feels like he's getting but he told this nurse he started antibiotics yesterday, advised to continue antibiotics as ordered by MD.

## 2013-11-06 ENCOUNTER — Telehealth (HOSPITAL_BASED_OUTPATIENT_CLINIC_OR_DEPARTMENT_OTHER): Payer: Self-pay | Admitting: HEMATOLOGY/ONCOLOGY

## 2013-11-06 ENCOUNTER — Ambulatory Visit (HOSPITAL_BASED_OUTPATIENT_CLINIC_OR_DEPARTMENT_OTHER)
Admission: RE | Admit: 2013-11-06 | Discharge: 2013-11-06 | Disposition: A | Discharge status: Home / Self Care | Payer: BC Managed Care – PPO | Source: Ambulatory Visit

## 2013-11-06 ENCOUNTER — Encounter (HOSPITAL_BASED_OUTPATIENT_CLINIC_OR_DEPARTMENT_OTHER): Payer: Self-pay | Admitting: HEMATOLOGY/ONCOLOGY

## 2013-11-06 ENCOUNTER — Ambulatory Visit
Admission: RE | Admit: 2013-11-06 | Discharge: 2013-11-06 | Disposition: A | Discharge status: Home / Self Care | Payer: BC Managed Care – PPO | Source: Ambulatory Visit | Attending: HEMATOLOGY/ONCOLOGY | Admitting: HEMATOLOGY/ONCOLOGY

## 2013-11-06 DIAGNOSIS — Z6837 Body mass index (BMI) 37.0-37.9, adult: Secondary | ICD-10-CM | POA: Insufficient documentation

## 2013-11-06 DIAGNOSIS — J329 Chronic sinusitis, unspecified: Secondary | ICD-10-CM | POA: Insufficient documentation

## 2013-11-06 DIAGNOSIS — C8590 Non-Hodgkin lymphoma, unspecified, unspecified site: Secondary | ICD-10-CM | POA: Insufficient documentation

## 2013-11-06 DIAGNOSIS — Z809 Family history of malignant neoplasm, unspecified: Secondary | ICD-10-CM | POA: Insufficient documentation

## 2013-11-06 DIAGNOSIS — E669 Obesity, unspecified: Secondary | ICD-10-CM | POA: Insufficient documentation

## 2013-11-06 DIAGNOSIS — I10 Essential (primary) hypertension: Secondary | ICD-10-CM | POA: Insufficient documentation

## 2013-11-06 DIAGNOSIS — C8589 Other specified types of non-Hodgkin lymphoma, extranodal and solid organ sites: Secondary | ICD-10-CM

## 2013-11-06 DIAGNOSIS — J45909 Unspecified asthma, uncomplicated: Secondary | ICD-10-CM | POA: Insufficient documentation

## 2013-11-06 DIAGNOSIS — R002 Palpitations: Secondary | ICD-10-CM | POA: Insufficient documentation

## 2013-11-06 DIAGNOSIS — C8581 Other specified types of non-Hodgkin lymphoma, lymph nodes of head, face, and neck: Secondary | ICD-10-CM | POA: Insufficient documentation

## 2013-11-06 DIAGNOSIS — Z7982 Long term (current) use of aspirin: Secondary | ICD-10-CM | POA: Insufficient documentation

## 2013-11-06 LAB — TYPE AND SCREEN
ABO/RH(D): A POS
ANTIBODY SCREEN: NEGATIVE

## 2013-11-06 LAB — IMMUNOGLOBULIN A (IGA), SERUM: IMMUNOGLOBULIN A: 143 mg/dL (ref 85–499)

## 2013-11-06 LAB — ALK PHOS (ALKALINE PHOSPHATASE): ALKALINE PHOSPHATASE: 70 U/L (ref <150)

## 2013-11-06 LAB — BILIRUBIN TOTAL: BILIRUBIN, TOTAL: 0.4 mg/dL (ref 0.3–1.3)

## 2013-11-06 LAB — HEPATITIS B SURFACE ANTIBODY: AB TO HEP B SURFACE AG: <5 (ref <8)

## 2013-11-06 LAB — CREATININE WITH EGFR: CREATININE: 0.99 mg/dL (ref 0.62–1.27)

## 2013-11-06 LAB — KAPPA AND LAMBDA FREE LIGHT CHAINS, SERUM
KAP/LAM FREE LT CHAIN RATIO: 0.623 mg/dL (ref 0.26–1.65)
KAPPA FREE LIGHT CHAINS: 1.85 mg/dL (ref 0.33–1.94)
LAMBDA FREE LIGHT CHAINS: 2.97 mg/dL — ABNORMAL HIGH (ref 0.57–2.63)

## 2013-11-06 LAB — HEPATITIS B CORE ANTIBODY: AB TO HEP. B CORE AG: NEGATIVE

## 2013-11-06 LAB — BLOOD CELL COUNT W/DIFF - CANCER CENTER
BASOPHILS: 3 %
BASOS ABS: 0.2 10*3/uL (ref 0.000–0.200)
EOS ABS: 0.297 10*3/uL (ref 0.000–0.500)
EOSINOPHIL: 5 %
HCT: 40.2 % (ref 36.7–47.0)
HGB: 12.2 g/dL — ABNORMAL LOW (ref 12.5–16.3)
LYMPHOCYTES: 33 %
LYMPHS ABS: 2.215 THOU/uL (ref 1.000–4.800)
MCH: 24 pg — ABNORMAL LOW (ref 27.4–33.0)
MCHC: 30.4 g/dL — ABNORMAL LOW (ref 32.5–35.8)
MCV: 78.8 fL (ref 78–100)
MONOCYTES: 12 %
MONOS ABS: 0.805 THOU/uL (ref 0.300–1.000)
MPV: 7.4 fL — ABNORMAL LOW (ref 7.5–11.5)
PLATELET COUNT (AUTO): 368 THOU/uL (ref 140–450)
PLATELET COUNT: 368 10*3/uL (ref 140–450)
PMN ABS (AUTO): 3.146 10*3/uL (ref 1.5–7.7)
PMN ABS: 3.146 THOU/uL (ref 1.500–7.700)
PMN'S: 47 %
RBC: 5.1 MIL/uL (ref 4.06–5.63)
RDW: 15.7 % — ABNORMAL HIGH (ref 12.0–15.0)
WBC: 6.7 THOU/uL (ref 3.5–11.0)

## 2013-11-06 LAB — SODIUM: SODIUM: 140 mmol/L (ref 136–145)

## 2013-11-06 LAB — TOTAL PROTEIN: TOTAL PROTEIN: 7.8 g/dL (ref 6.4–8.3)

## 2013-11-06 LAB — BUN
BUN/CREAT RATIO: 17 (ref 6–22)
BUN: 17 mg/dL (ref 8–25)

## 2013-11-06 LAB — CARBON DIOXIDE (CO2, BICARBONATE)
ANION GAP: 12 mmol/L (ref 4–13)
CARBON DIOXIDE: 23 mmol/L (ref 22–32)

## 2013-11-06 LAB — PHOSPHORUS: PHOSPHORUS: 3.4 mg/dL (ref 2.4–4.7)

## 2013-11-06 LAB — URIC ACID: URIC ACID: 6.5 mg/dL (ref 3.5–7.0)

## 2013-11-06 LAB — ALBUMIN: ALBUMIN: 3.6 g/dL (ref 3.5–5.0)

## 2013-11-06 LAB — CREATININE: ESTIMATED GLOMERULAR FILTRATION RATE: >59 mL/min/{1.73_m2} (ref >59)

## 2013-11-06 LAB — CALCIUM: CALCIUM: 9.6 mg/dL (ref 8.5–10.4)

## 2013-11-06 LAB — HEPATITIS B SURFACE ANTIGEN: HEPATITIS B SURFACE AG: NEGATIVE

## 2013-11-06 LAB — IMMUNOGLOBULIN G (IGG), SERUM: IMMUNOGLOBULIN G: 1341 mg/dL (ref 610–1616)

## 2013-11-06 LAB — AST (SGOT): AST (SGOT): 23 U/L (ref 8–48)

## 2013-11-06 LAB — IMMUNOGLOBULIN M (IGM), SERUM: IMMUNOGLOBULIN M: 82 mg/dL (ref 35–242)

## 2013-11-06 LAB — POTASSIUM: POTASSIUM: 4.1 mmol/L (ref 3.5–5.1)

## 2013-11-06 LAB — ALT (SGPT): ALT (SGPT): 28 U/L (ref <55)

## 2013-11-06 LAB — CHLORIDE: CHLORIDE: 105 mmol/L (ref 96–111)

## 2013-11-06 LAB — BETA-2 MICROGLOBULIN (B2M), SERUM: BETA-2-MICROGLOBULIN, SERUM: 1.58 ug/mL (ref 0.80–2.34)

## 2013-11-06 LAB — LDH: LDH: 214 U/L (ref 125–220)

## 2013-11-06 NOTE — Cancer Center Note (Signed)
[facility]  History and Physical Note    Date: 11/06/2013  Name: Jason Ball  MRN: [id number]  Referring Physician: Jamal Maes, MD PHD  Primary Care Provider: Dolores Lory, DO    Reason for visit/consultation Establish Care and Lymphoma  Information Obtained from: patient, spouse, son and history reviewed via medical record  ID: 53 year old male with Marginal zone NHL, biopsy of nasal mass    HPI: Developed recurrent sinus symptoms in summer/fall 2014.  Recurrent sinus infections, treated with antibiotics, as well as occ steroids.  He underwent evaluation with ENT.  Had ct scans done, with infections.  Also with patient reports of enlarged lymph nodes.  Had R axillary node biopsy at [facility] in fall 2014: normal.  Had repeat CT scans of sinus, done here 12/14, with nasal mass, followed by MRI 1/15: nasal mass.  CT sinus: bones 1/26:nasal mass.  Underwent biopsy of nasal mass 10/21/13: extranodal marginal zone NHL.   He has symptoms of sinus pressure, drainage, sometimes with small amounts of blood in nasal drainage.  He has occ night sweats.  Has had had swelling in neck, mainly on L, off and on.   Has fatigue, malaise, anxiety.  No weight loss, no fever.     PMH: HTN for several years, R axillary node biopsy in fall 2014, eye surgery, colonoscopy 2 years ago: normal,     Social History: married, 2 children.  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  No tobacco use, rare alcohol use.      Family History: dad died with large mass around kidney, unknown type.  Mom living with Parkinson's.  1 brother healthy      ROS: Other than ROS in the HPI, all other review of systems were negative except for: headaches    Past Medical History  Past Medical History   Diagnosis Date   . HTN (hypertension)    . Asthma      well controlled without medications   . Upper respiratory infection      sinusitis 2 weeks ago - cleared up   . Hypertension    . Palpitations      with anxiety   . Ringing in ears    . Blurred vision    . Obesity    . Sinusitis, chronic    . Tattoos      x1 left upper arm     Current Outpatient Prescriptions   Medication Sig   . amLODIPine-benazepril (LOTREL) 5-20 mg Oral Capsule Take 1 Cap by mouth Once a day   . Amoxicillin (AMOXIL) 875 mg Oral Tablet Take 875 mg by mouth Twice daily 1/2 tablets twice daily   . aspirin (ECOTRIN) 81 mg Oral Tablet, Delayed Release (E.C.) Take 81 mg by mouth Once a day   . beclomethasone dipropionate (QNASL) 80 mcg/actuation Nasal HFA Aerosol Inhaler 160 mcg by Nasal route Once a day     Physical Examination:  Most Recent Vitals      Lab from 11/06/2013 in [facility]    Temperature 36.7 C (98.1 F) filed at... 11/06/2013 0916    Heart Rate 83 filed at... 11/06/2013 0916    Respiratory Rate 18 filed at... 11/06/2013 0916    BP (Non-Invasive) ! 152/86 mmHg filed at... 11/06/2013 0916    Height 1.803 m (5' 11") filed at... 11/06/2013 0916    Weight 123 kg (271 lb 2.7 oz) filed  at... 11/06/2013 0916    BMI (Calculated) 37.9 filed at... 11/06/2013 0916    BSA (Calculated) 2.48 filed at... 11/06/2013 0916        ECOG Status: 1 - Restricted in physically strenuous activity, but ambulatory and able to carry out work of a light or sedentary nature, e.g., light housework, office work.KPS 90  General: appears in good health  Eyes: Conjunctiva injected., Pupils equal and round.   HENT:Nasal drainage, some tenderness, small amount of swelling around L eye  Neck: No JVD or thyromegaly.  Ha 1-2 cm L neck nodes palpable  Lungs: decreased breath sounds bibasilar  Cardiovascular: Regular rate and rhythm  Abdomen: soft, non-tender, bowel sounds normal and no hepatosplenomegaly  Extremities: no cyanosis or edema and no synovitis or joint effusions  Skin: Skin warm and dry, No rashes and No lesions  Neurologic: grossly normal  Lymphatics: L neck node: supraclav: 1-2 cm  Psychiatric: AOx3 and affect normal  Access:  No current access  site    Labs  Results for orders placed during the hospital encounter of 11/06/13 (from the past 36 hour(s))   ALBUMIN       Result Value Range    ALBUMIN 3.6  3.5 - 5.0 g/dL   ALK PHOS (ALKALINE PHOSPHATASE)       Result Value Range    ALKALINE PHOSPHATASE 70  <150 U/L   ALT (SGPT)       Result Value Range    ALT (SGPT) 28  <55 U/L   AST (SGOT)       Result Value Range    AST (SGOT) 23  8 - 48 U/L   BILIRUBIN TOTAL       Result Value Range    BILIRUBIN, TOTAL 0.4  0.3 - 1.3 mg/dL   BLOOD CELL COUNT W/DIFF - CANCER CENTER       Result Value Range    WBC 6.7  3.5 - 11.0 THOU/uL    RBC 5.10  4.06 - 5.63 MIL/uL    HGB 12.2 (*) 12.5 - 16.3 g/dL    HCT 40.2  36.7 - 47.0 %    MCV 78.8  78 - 100 fL    MCH 24.0 (*) 27.4 - 33.0 pg    MCHC 30.4 (*) 32.5 - 35.8 g/dL    RDW 15.7 (*) 12.0 - 15.0 %    PLATELET COUNT 368  140 - 450 THOU/uL    MPV 7.4 (*) 7.5 - 11.5 fL    PMN'S 47      PMN ABS 3.146  1.500 - 7.700 THOU/uL    LYMPHOCYTES 33      LYMPHS ABS 2.215  1.000 - 4.800 THOU/uL    MONOCYTES 12      MONOS ABS 0.805  0.300 - 1.000 THOU/uL    EOSINOPHIL 5      EOS ABS 0.297  0.000 - 0.500 THOU/uL    BASOPHILS 3      BASOS ABS 0.200  0.000 - 0.200 THOU/uL    PMN ABS 3.146  1.5 - 7.7 THOU/uL    PLATELET COUNT 368  140 - 450 THOU/uL   BUN       Result Value Range    BUN 17  8 - 25 mg/dL    BUN/CREAT RATIO 17  6 - 22   CALCIUM       Result Value Range    CALCIUM 9.6  8.5 - 10.4 mg/dL   CARBON DIOXIDE (CO2, BICARBONATE)  Result Value Range    CARBON DIOXIDE 23  22 - 32 mmol/L    ANION GAP 12  4 - 13 mmol/L   CHLORIDE       Result Value Range    CHLORIDE 105  96 - 111 mmol/L   CREATININE       Result Value Range    CREATININE 0.99  0.62 - 1.27 mg/dL    ESTIMATED GLOMERULAR FILTRATION RATE >59  >59 ml/min/1.17m   LDH       Result Value Range    LDH 214  125 - 220 U/L   PHOSPHORUS       Result Value Range    PHOSPHORUS 3.4  2.4 - 4.7 mg/dL   POTASSIUM       Result Value Range    POTASSIUM 4.1  3.5 - 5.1 mmol/L   PROTEIN TOTAL,  SERUM       Result Value Range    TOTAL PROTEIN 7.8  6.4 - 8.3 g/dL   SERUM PROTEIN ELECTROPHORESIS       Result Value Range    SERUM ELP PENDING      TOT PROTEIN FOR ELP 7.5  6.0 - 7.9 g/dL    ALBUMIN FOR ELP 3.6  3.5 - 5.0 g/dL   SODIUM       Result Value Range    SODIUM 140  136 - 145 mmol/L   URIC ACID       Result Value Range    URIC ACID 6.5  3.5 - 7.0 mg/dL   HEPATITIS B CORE ANTIBODY       Result Value Range    AB TO HEP. B CORE AG NEGATIVE  NEGATIVE   HEPATITIS B SURFACE ANTIBODY       Result Value Range    AB TO HEP B SURFACE AG <5  <8   HEPATITIS B SURFACE ANTIGEN       Result Value Range    HEPATITIS B SURFACE AG NEGATIVE  NEGATIVE     Radiology  reviewed MRI, which has small nodes  Assessment/Plan:   1. Lymphoma    2. Asthma    3. Hypertension    4. Palpitations    5. Obesity    6. Sinusitis, chronic      1. NHL: newly diagnosed, extranodal marginal zone of sinus cavity, with plasmacytic differentiation   Symptoms and enlarged nodes suggest systemic disease    Recommended PET/CT    Bone marrow biopsy (Bilateral)    Recommend Ig eval, electropheresis and IFE   In terms of treatment, localized only: consider RT   If systemic, will need to consider systemic therapy, which may be such things as Bendamustine, velcade or CHOP based therapy.  I am not sure of the CD20 expression in the path, will ask to calrify that, as rituximab has large role to play in patients with cd20 expression and low grade disease.  Will also ask for KI-67.   Discussed diagnosis, prognosis, staging, treatment of this disease with patient and family, answered all questions as best I could with currently available data    Return to clinic in 1-2 weeks for results review      MHarmon Dun MD

## 2013-11-06 NOTE — Telephone Encounter (Signed)
Call to the patient regarding Friday's bone marrow biopsy.  He was instructed to eat before his visit on Friday.  He was also instructed to bring a driver in case he would like to have some pre-medications before the procedure.  He verbalized understanding and agreement with this plan.-P. Perrin Smack, RN

## 2013-11-07 LAB — IMMUNOSUBTRACTION, SERUM

## 2013-11-08 ENCOUNTER — Ambulatory Visit (HOSPITAL_BASED_OUTPATIENT_CLINIC_OR_DEPARTMENT_OTHER)
Admission: RE | Admit: 2013-11-08 | Discharge: 2013-11-08 | Disposition: A | Discharge status: Home / Self Care | Payer: BC Managed Care – PPO | Source: Ambulatory Visit | Admitting: Medical

## 2013-11-08 ENCOUNTER — Ambulatory Visit
Admission: RE | Admit: 2013-11-08 | Discharge: 2013-11-08 | Disposition: A | Discharge status: Home / Self Care | Payer: BC Managed Care – PPO | Source: Ambulatory Visit | Attending: HEMATOLOGY/ONCOLOGY | Admitting: HEMATOLOGY/ONCOLOGY

## 2013-11-08 ENCOUNTER — Encounter (HOSPITAL_BASED_OUTPATIENT_CLINIC_OR_DEPARTMENT_OTHER): Payer: Self-pay | Admitting: Medical

## 2013-11-08 ENCOUNTER — Other Ambulatory Visit (HOSPITAL_BASED_OUTPATIENT_CLINIC_OR_DEPARTMENT_OTHER): Payer: Self-pay | Admitting: Medical

## 2013-11-08 ENCOUNTER — Ambulatory Visit (HOSPITAL_BASED_OUTPATIENT_CLINIC_OR_DEPARTMENT_OTHER)
Admission: RE | Admit: 2013-11-08 | Discharge: 2013-11-08 | Disposition: A | Discharge status: Home / Self Care | Payer: BC Managed Care – PPO | Source: Ambulatory Visit | Attending: HEMATOLOGY/ONCOLOGY | Admitting: HEMATOLOGY/ONCOLOGY

## 2013-11-08 DIAGNOSIS — I10 Essential (primary) hypertension: Secondary | ICD-10-CM

## 2013-11-08 DIAGNOSIS — C8589 Other specified types of non-Hodgkin lymphoma, extranodal and solid organ sites: Secondary | ICD-10-CM

## 2013-11-08 DIAGNOSIS — J45909 Unspecified asthma, uncomplicated: Secondary | ICD-10-CM

## 2013-11-08 DIAGNOSIS — J329 Chronic sinusitis, unspecified: Secondary | ICD-10-CM

## 2013-11-08 DIAGNOSIS — C8590 Non-Hodgkin lymphoma, unspecified, unspecified site: Secondary | ICD-10-CM

## 2013-11-08 DIAGNOSIS — R002 Palpitations: Secondary | ICD-10-CM

## 2013-11-08 DIAGNOSIS — C8585 Other specified types of non-Hodgkin lymphoma, lymph nodes of inguinal region and lower limb: Secondary | ICD-10-CM | POA: Insufficient documentation

## 2013-11-08 DIAGNOSIS — E669 Obesity, unspecified: Secondary | ICD-10-CM

## 2013-11-08 LAB — CBC/DIFF
BASOPHILS: 1 %
BASOS ABS: 0.106 10*3/uL (ref 0.000–0.200)
EOS ABS: 0.314 10*3/uL (ref 0.000–0.500)
EOSINOPHIL: 4 %
HCT: 40.1 % (ref 36.7–47.0)
HGB: 12.5 g/dL (ref 12.5–16.3)
LYMPHOCYTES: 30 %
LYMPHS ABS: 2.295 10*3/uL (ref 1.000–4.800)
MCH: 24.7 pg — ABNORMAL LOW (ref 27.4–33.0)
MCHC: 31.1 g/dL — ABNORMAL LOW (ref 32.5–35.8)
MCV: 79.2 fL (ref 78–100)
MONOCYTES: 12 %
MONOS ABS: 0.929 THOU/uL (ref 0.300–1.000)
MPV: 7.4 fL — ABNORMAL LOW (ref 7.5–11.5)
PLATELET COUNT: 349 10*3/uL (ref 140–450)
PMN ABS: 3.98 10*3/uL (ref 1.500–7.700)
PMN'S: 53 %
RBC: 5.07 MIL/uL (ref 4.06–5.63)
RDW: 16 % — ABNORMAL HIGH (ref 12.0–15.0)
WBC: 7.6 10*3/uL (ref 3.5–11.0)

## 2013-11-08 LAB — RETICULOCYTE COUNT
IMMATURE RETIC FRACTION: 0.43 (ref 0.29–0.53)
MEAN RETIC VOLUME: 102.3 fL (ref 97.4–120.2)
RETICULOCYTE COUNT %: 1.4 % (ref 0.5–2.0)
RETICULOCYTE COUNT ABS: 71.3 10*3/uL (ref 22.1–96.3)

## 2013-11-08 LAB — ELECTROPHORESIS, PROTEIN, SERUM
ALBUMIN FOR ELP: 3.6 g/dL (ref 3.5–5.0)
SERUM ELP: NORMAL
TOT PROTEIN, SERUM: 7.5 g/dL (ref 6.0–7.9)

## 2013-11-08 MED ORDER — LIDOCAINE HCL 20 MG/ML (2 %) INJECTION SOLUTION
10.00 mL | Freq: Once | INTRAMUSCULAR | Status: AC | PRN
Start: 2013-11-08 — End: 2013-11-08
  Administered 2013-11-08: 200 mg via INTRADERMAL
  Filled 2013-11-08: qty 20

## 2013-11-08 MED ORDER — MORPHINE 2 MG/ML INTRAVENOUS CARTRIDGE
1.00 mg | CARTRIDGE | INTRAVENOUS | Status: AC
Start: 2013-11-08 — End: 2013-11-08
  Administered 2013-11-08 (×2): 1 mg via INTRAVENOUS
  Filled 2013-11-08: qty 1

## 2013-11-08 MED ORDER — LORAZEPAM 2 MG/ML INJECTION SOLUTION
1.00 mg | INTRAMUSCULAR | Status: AC
Start: 2013-11-08 — End: 2013-11-08
  Administered 2013-11-08: 1 mg via INTRAVENOUS
  Filled 2013-11-08: qty 1

## 2013-11-08 NOTE — Procedures (Addendum)
PATIENT NAME:  Jason Ball, Jason Ball ASA II   HOSPITAL NUMBER:  [id number]  DATE OF SERVICE:  11-08-2013  DATE OF BIRTH:      PROOCEDURE NOTE     NAME OF PROCEDURE: Bone marrow biopsy and aspirate.   ANESTHESIA: Morphine sulfate 1 mg and ativan 1 mg, IV    COMPLICATIONS: There were no complications during or after the procedure.   INDICATIONS FOR PROCEDURE: Assessment of marrow. New diagnosis of marginal zone lymphoma.     DESCRIPTION OF PROCEDURE: The area over the left and right posterior iliac crest was prepped and draped in a sterile manner. The biopsy and aspirate were obtained from the left posterior iliac crest. Another biopsy was obtained from the right posterior iliac crest.   He tolerated the procedure well. There was no excess bleeding or complications. The specimen contained spicules confirmed by the technologist and the specimen accompanied her to the lab for evaluation    Patient seen with the co-signing faculty, Dr Cecilie Lowers, in clinic    Orlan Leavens, PA-C  Section of Hematology/Oncology  [facility] Department of [place], [place] 11/08/2013, 10:06  Harmon Dun, MD 11/11/2013, 13:25

## 2013-11-08 NOTE — Nurses Notes (Signed)
0815-Pt to VAD area for PIV placement for a BMB and labs prior to MD visit. Vital signs taken. PIV placed in the right arm ac on the first attempt with a 22G, 1 inch cath. Flushes without difficulty, blood return present. Labs obtained. Flushed per protocol. Cap and transparent dressing applied. Pt ambulatory to exam room P4 in stable condition. L.Carolina Antolin RN

## 2013-11-08 NOTE — Discharge Instructions (Signed)
Post Bone Marrow Biopsy Instructions:    1) Rest for the remaining day.  No strenuous activity.  2) Leave bandage in place for about 48 hours.  3) Keep the bandage dry; no showering or  Bathing until the bandage is removed.  4) You may take your normal pain medications or use over-the-counter pain medication for any pain.  There may be some tenderness to the area.  That is normal.  5) After 48 hours the bandage can be removed and the area kept clean.    6) If there is a small amount of bleeding, apply pressure with the heel of your hand or lie on your back.  7) Some bruising can be expected at the biopsy site.    Notify your doctor if...  1) Bleeding soaks the bandage or does not stop after 24 hours.  2) Swelling or pus at the biopsy site.  3) Severe pain.  4) Fever greater than 101 degrees Fahrenheit.

## 2013-11-08 NOTE — Nurses Notes (Signed)
Pt instructed on BMB procedure, informed consent complete per [employer] PA. After consent obtained and allergies reviewed pt premedicated as per order with ativan 1mg  IVP and morphine 1mg  IVP via PIV with excellent bld return. Pt stable a present [employer]uff[employer] PA at bedside for procedure. Barkley Bruns RN

## 2013-11-11 ENCOUNTER — Ambulatory Visit
Admission: RE | Admit: 2013-11-11 | Discharge: 2013-11-11 | Disposition: A | Discharge status: Home / Self Care | Payer: BC Managed Care – PPO | Source: Ambulatory Visit | Attending: HEMATOLOGY/ONCOLOGY | Admitting: HEMATOLOGY/ONCOLOGY

## 2013-11-11 DIAGNOSIS — C8589 Other specified types of non-Hodgkin lymphoma, extranodal and solid organ sites: Secondary | ICD-10-CM

## 2013-11-11 DIAGNOSIS — R599 Enlarged lymph nodes, unspecified: Secondary | ICD-10-CM

## 2013-11-11 DIAGNOSIS — C8590 Non-Hodgkin lymphoma, unspecified, unspecified site: Secondary | ICD-10-CM

## 2013-11-11 DIAGNOSIS — N200 Calculus of kidney: Secondary | ICD-10-CM

## 2013-11-11 LAB — HISTORICAL BONE MARROW ASPIRATE/BIOPSY

## 2013-11-11 LAB — PERFORM POC WHOLE BLOOD GLUCOSE: GLUCOSE, POINT OF CARE: 96 mg/dL (ref 70–105)

## 2013-11-11 MED ORDER — DIATRIZOATE MEGLUMINE & SODIUM 66 %-10 % (GASTROGRAFIN) ORAL SOLN
10.00 mL | ORAL | Status: AC
Start: 2013-11-11 — End: 2013-11-11
  Administered 2013-11-11: 09:00:00 10 mL via ORAL

## 2013-11-11 NOTE — Addendum Note (Signed)
Encounter addended by: Manus Rudd on: 11/11/2013  7:12 AM<BR>     Documentation filed: Charges VN

## 2013-11-12 LAB — BONE MARROW ASPIRATE DIFFERENTIAL

## 2013-11-18 ENCOUNTER — Encounter (HOSPITAL_BASED_OUTPATIENT_CLINIC_OR_DEPARTMENT_OTHER): Payer: Self-pay | Admitting: HEMATOLOGY/ONCOLOGY

## 2013-11-18 ENCOUNTER — Ambulatory Visit
Admission: RE | Admit: 2013-11-18 | Discharge: 2013-11-18 | Disposition: A | Discharge status: Home / Self Care | Payer: BC Managed Care – PPO | Source: Ambulatory Visit | Attending: HEMATOLOGY/ONCOLOGY | Admitting: HEMATOLOGY/ONCOLOGY

## 2013-11-18 ENCOUNTER — Ambulatory Visit (HOSPITAL_BASED_OUTPATIENT_CLINIC_OR_DEPARTMENT_OTHER)
Admission: RE | Admit: 2013-11-18 | Discharge: 2013-11-18 | Disposition: A | Discharge status: Home / Self Care | Payer: BC Managed Care – PPO | Source: Ambulatory Visit | Attending: HEMATOLOGY/ONCOLOGY | Admitting: HEMATOLOGY/ONCOLOGY

## 2013-11-18 DIAGNOSIS — C8590 Non-Hodgkin lymphoma, unspecified, unspecified site: Principal | ICD-10-CM

## 2013-11-18 DIAGNOSIS — Z7982 Long term (current) use of aspirin: Secondary | ICD-10-CM | POA: Insufficient documentation

## 2013-11-18 DIAGNOSIS — C8589 Other specified types of non-Hodgkin lymphoma, extranodal and solid organ sites: Secondary | ICD-10-CM

## 2013-11-18 DIAGNOSIS — Z87442 Personal history of urinary calculi: Secondary | ICD-10-CM | POA: Insufficient documentation

## 2013-11-18 DIAGNOSIS — I10 Essential (primary) hypertension: Secondary | ICD-10-CM | POA: Insufficient documentation

## 2013-11-18 DIAGNOSIS — C8581 Other specified types of non-Hodgkin lymphoma, lymph nodes of head, face, and neck: Secondary | ICD-10-CM | POA: Insufficient documentation

## 2013-11-18 LAB — BLOOD CELL COUNT W/DIFF - CANCER CENTER
BASOPHILS: 1 %
BASOS ABS: 0.1 10*3/uL (ref 0.000–0.200)
EOS ABS: 0.276 10*3/uL (ref 0.000–0.500)
EOSINOPHIL: 4 %
HCT: 38.8 % (ref 36.7–47.0)
HGB: 12 g/dL — ABNORMAL LOW (ref 12.5–16.3)
LYMPHOCYTES: 32 %
LYMPHS ABS: 2.261 10*3/uL (ref 1.000–4.800)
MCH: 24.3 pg — ABNORMAL LOW (ref 27.4–33.0)
MCHC: 30.9 g/dL — ABNORMAL LOW (ref 32.5–35.8)
MCHC: 30.9 g/dL — ABNORMAL LOW (ref 32.5–35.8)
MCV: 78.5 fL (ref 78–100)
MONOCYTES: 13 %
MONOS ABS: 0.927 10*3/uL (ref 0.300–1.000)
MPV: 7.4 fL — ABNORMAL LOW (ref 7.5–11.5)
PLATELET COUNT (AUTO): 395 10*3/uL (ref 140–450)
PLATELET COUNT: 395 10*3/uL (ref 140–450)
PMN ABS (AUTO): 3.537 THOU/uL (ref 1.5–7.7)
PMN ABS: 3.537 10*3/uL (ref 1.500–7.700)
PMN ABS: 3.537 10*3/uL (ref 1.500–7.700)
PMN'S: 50 %
PMN'S: 50 %
RBC: 4.94 MIL/uL (ref 4.06–5.63)
RDW: 16 % — ABNORMAL HIGH (ref 12.0–15.0)
WBC: 7.1 THOU/uL (ref 3.5–11.0)

## 2013-11-18 LAB — CREATININE WITH EGFR
CREATININE: 0.86 mg/dL (ref 0.62–1.27)
ESTIMATED GLOMERULAR FILTRATION RATE: >59 ml/min/1.73m2 (ref >59)

## 2013-11-18 LAB — CHLORIDE: CHLORIDE: 107 mmol/L (ref 96–111)

## 2013-11-18 LAB — SODIUM
SODIUM: 141 mmol/L (ref 136–145)
SODIUM: 141 mmol/L (ref 136–145)

## 2013-11-18 LAB — ALBUMIN: ALBUMIN: 3.7 g/dL (ref 3.5–5.0)

## 2013-11-18 LAB — BUN
BUN/CREAT RATIO: 14 (ref 6–22)
BUN: 12 mg/dL (ref 8–25)

## 2013-11-18 LAB — BILIRUBIN TOTAL: BILIRUBIN, TOTAL: 0.2 mg/dL — ABNORMAL LOW (ref 0.3–1.3)

## 2013-11-18 LAB — CARBON DIOXIDE (CO2, BICARBONATE)
ANION GAP: 9 mmol/L (ref 4–13)
CARBON DIOXIDE: 25 mmol/L (ref 22–32)

## 2013-11-18 LAB — PHOSPHORUS: PHOSPHORUS: 3.4 mg/dL (ref 2.4–4.7)

## 2013-11-18 LAB — ALK PHOS (ALKALINE PHOSPHATASE): ALKALINE PHOSPHATASE: 67 U/L (ref <150)

## 2013-11-18 LAB — LDH: LDH: 200 U/L (ref 125–220)

## 2013-11-18 LAB — AST (SGOT): AST (SGOT): 21 U/L (ref 8–48)

## 2013-11-18 LAB — ALT (SGPT): ALT (SGPT): 25 U/L (ref <55)

## 2013-11-18 LAB — CALCIUM: CALCIUM: 9.4 mg/dL (ref 8.5–10.4)

## 2013-11-18 LAB — POTASSIUM: POTASSIUM: 4 mmol/L (ref 3.5–5.1)

## 2013-11-18 NOTE — Cancer Center Note (Addendum)
[facility]  Return Patient Progress Note    Date: 11/18/2013  Name: Jason Ball  MRN: [id number]  DOB:    Referring Physician:  Jamal Maes, MD  Primary Care Provider: Dolores Lory, DO    Reason for visit/consultation:  Evaluation and management of marginal zone lymphoma  PATIENT IDENTIFICATION:   This is a 53 year old male with marginal zone NHL, biopsy of nasal mass   Developed recurrent sinus symptoms in summer/fall 2014. Recurrent sinus infections, treated with antibiotics, as well as occ steroids. He underwent evaluation with ENT. Had ct scans done, with infections. Also with patient reports of enlarged lymph nodes. Had R axillary node biopsy at [facility] in fall 2014: normal. Had repeat CT scans of sinus, done here 12/14, with nasal mass, followed by MRI 1/15: nasal mass. CT sinus: bones 1/26:nasal mass. Underwent biopsy of nasal mass 10/21/13: extranodal marginal zone NHL.   He has symptoms of sinus pressure, drainage, sometimes with small amounts of blood in nasal drainage. He has occ night sweats. Has had swelling in neck, mainly on L, off and on. Has fatigue, malaise, anxiety.   No weight loss, no fever.   PMH: HTN for several years, R axillary node biopsy in fall 2014, eye surgery, colonoscopy 2 years ago: normal, nephrolithiasis, prostatitis  Social History: He is married and lives in [place], [place].  2 children.   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 No tobacco use, rare alcohol use.   With wife and son today.  Family History: dad died with large mass around kidney, unknown type. Mom living with Parkinson's. 1 brother healthy     Interval History:  Information Obtained from: patient  Here today for planned visit.  To discuss recent test results and treatment options.  Feeling OK since bone marrow biopsy.   Anxious about results and treatment      Past Medical History  Past Medical History   Diagnosis Date   . HTN (hypertension)    . Asthma     well controlled without medications   . Upper respiratory infection      sinusitis 2 weeks ago - cleared up   . Hypertension    . Palpitations      with anxiety   . Ringing in ears    . Blurred vision    . Obesity    . Sinusitis, chronic    . Tattoos      x1 left upper arm     ALLERGIES:  nkda  Current Outpatient Prescriptions   Medication Sig   . amLODIPine-benazepril (LOTREL) 5-20 mg Oral Capsule Take 1 Cap by mouth Once a day   .     Marland Kitchen aspirin (ECOTRIN) 81 mg Oral Tablet, Delayed Release (E.C.) Take 81 mg by mouth Once a day   .       Physical Examination:  Most Recent Vitals      Lab from 11/18/2013 in [facility]    Temperature 37.3 C (99.1 F) filed at... 11/18/2013 1555    Heart Rate 98 filed at... 11/18/2013 1555    Respiratory Rate 18 filed at... 11/18/2013 1555    BP (Non-Invasive) ! 141/104 mmHg filed at... 11/18/2013 1555    Height 1.803 m ('5\' 11"' ) filed at... 11/18/2013 1555    Weight 124 kg (273 lb 5.9 oz) filed at... 11/18/2013 1555    BMI (Calculated) 38.21 filed at... 11/18/2013 1555    BSA (  Calculated) 2.49 filed at... 11/18/2013 1555        ECOG Status: 1 - Restricted in physically strenuous activity, but ambulatory and able to carry out work of a light or sedentary nature, e.g., light housework, office work.      Labs  Results for Jason Ball, Jason Ball ASA II (MRN [id number]) as of 11/19/2013 14:52   Ref. Range 11/18/2013 15:37   WBC Latest Range: 3.5-11.0 THOU/uL 7.1   HGB Latest Range: 12.5-16.3 g/dL 12.0 (L)   HCT Latest Range: 36.7-47.0 % 38.8   PLATELET COUNT Latest Range: 140-450 THOU/uL 395   RBC Latest Range: 4.06-5.63 MIL/uL 4.94   MCV Latest Range: 78-100 fL 78.5   MCHC Latest Range: 32.5-35.8 g/dL 30.9 (L)   MCH Latest Range: 27.4-33.0 pg 24.3 (L)   RDW Latest Range: 12.0-15.0 % 16.0 (H)   MPV Latest Range: 7.5-11.5 fL 7.4 (L)   PLATELET COUNT Latest Range: 140-450 THOU/uL 395   PMN'S No range found 50   LYMPHOCYTES No range found 32   EOSINOPHIL No range found 4   MONOCYTES No range found 13      BASOPHILS No range found 1   PMN ABS Latest Range: 1.500-7.700 THOU/uL 3.537   LYMPHS ABS Latest Range: 1.000-4.800 THOU/uL 2.261   EOS ABS Latest Range: 0.000-0.500 THOU/uL 0.276   MONOS ABS Latest Range: 0.300-1.000 THOU/uL 0.927   BASOS ABS Latest Range: 0.000-0.200 THOU/uL 0.100   PMN ABS Latest Range: 1.5-7.7 THOU/uL 3.537   SODIUM Latest Range: 136-145 mmol/L 141   POTASSIUM Latest Range: 3.5-5.1 mmol/L 4.0   CHLORIDE Latest Range: 96-111 mmol/L 107   CARBON DIOXIDE Latest Range: 22-32 mmol/L 25   BUN Latest Range: 8-25 mg/dL 12   CREATININE Latest Range: 0.62-1.27 mg/dL 0.86   ANION GAP Latest Range: 4-13 mmol/L 9   BUN/CREAT RATIO Latest Range: 6-22  14   ESTIMATED GLOMERULAR FILTRATION RATE Latest Range: >59 ml/min/1.42m >59   CALCIUM Latest Range: 8.5-10.4 mg/dL 9.4   PHOSPHORUS Latest Range: 2.4-4.7 mg/dL 3.4   ALBUMIN Latest Range: 3.5-5.0 g/dL 3.7   BILIRUBIN, TOTAL Latest Range: 0.3-1.3 mg/dL 0.2 (L)   AST (SGOT) Latest Range: 8-48 U/L 21   ALT (SGPT) Latest Range: <55 U/L 25   ALKALINE PHOSPHATASE Latest Range: <150 U/L 67   LDH Latest Range: 125-220 U/L 200       PATHOLOGY REVIEWED WITH PATIENT  Bone marrow biopsy Nov 08, 2013  No involvement by lymphoma     IMAGING REVIEWED WITH PATIENT  PET/CT Nov 11, 2013  Right axillary, pretracheal, and subcarinal nodes seen.  SUV ranging from 3.5 to 4.2  Bilateral inguinal nodes with SUV 4.4 and 4.5  Sinusoidal mass with SUV approx 6.7  Atherosclerotic disease in LAD.    Nonobstructing renal calculi seen.  Prominent asymmetric prostate gland.    ASSESSMENT:  1.Extranodal marginal zone lymphoma, stage III, A, E  2.Prominent prostate on scan  3.HTN  PLAN:  1.Reviewed all recent testing.    2.Discussed treatment with bendamustine and rituxan.  Will plan for 6 cycles.  Begin March 9 and day 2 March 10.  3.Phenergan and Zofran escripted to his pharmacy for prevention of nausea  4.Chemotherapy consent signed today  5.Chemotherapy teaching done today   6.PSA to be  done on the first day of treatment  7.Cardiology visit suggested to further evaluate changes seen in the PET  8.Will re-scan after 2-4 cycles to assess response  9.Continue medications as prescribed  10.Follow with other providers  as necessary  11.Begin cycle 1 on March 9 and return March 10 for day 2.  12.Return to clinic in March 30 for MD visit, labs to include HM1 and begin cycle 2  13.Return sooner if problems or concerns    Dr Cecilie Lowers also saw this patient  Patient seen with the co-signing faculty, Dr Cecilie Lowers, in clinic    Orlan Leavens, PA-C  Section of Hematology/Oncology  [facility] Department of [place], [place]  11/19/2013, 16:24       I personally saw the patient. See mid-level's note for additional details. My findings/particpation are NHL  Reviewed options for treament, stage 3 a  Consider rituximab, BR or R-CHOP  Reviewed options, side effects  We decided to start with BR,  Side effects reviewed  Goals of care; palliative. (incurable disease)  No pain    Harmon Dun, MD 11/20/2013, 15:27

## 2013-11-18 NOTE — Discharge Instructions (Signed)
Rituximab injection What is this medicine? RITUXIMAB (ri TUX i mab) is a monoclonal antibody. This medicine changes the way the body's immune system works. It is used commonly to treat non-Hodgkin's lymphoma and other conditions. In cancer cells, this drug targets a specific protein within cancer cells and stops the cancer cells from growing. It is also used to treat rhuematoid arthritis (RA). In RA, this medicine slow the inflammatory process and help reduce joint pain and swelling. This medicine is often used with other cancer or arthritis medications. This medicine may be used for other purposes; ask your health care provider or pharmacist if you have questions. COMMON BRAND NAME(S): Rituxan What should I tell my health care provider before I take this medicine? They need to know if you have any of these conditions: -blood disorders -heart disease -history of hepatitis B -infection (especially a virus infection such as chickenpox, cold sores, or herpes) -irregular heartbeat -kidney disease -lung or breathing disease, like asthma -lupus -an unusual or allergic reaction to rituximab, mouse proteins, other medicines, foods, dyes, or preservatives -pregnant or trying to get pregnant -breast-feeding How should I use this medicine? This medicine is for infusion into a vein. It is administered in a hospital or clinic by a specially trained health care professional. A special MedGuide will be given to you by the pharmacist with each prescription and refill. Be sure to read this information carefully each time. Talk to your pediatrician regarding the use of this medicine in children. This medicine is not approved for use in children. Overdosage: If you think you have taken too much of this medicine contact a poison control center or emergency room at once. NOTE: This medicine is only for you. Do not share this medicine with others. What if I miss a dose? It is important not to miss a dose. Call  your doctor or health care professional if you are unable to keep an appointment. What may interact with this medicine? -cisplatin -medicines for blood pressure -some other medicines for arthritis -vaccines This list may not describe all possible interactions. Give your health care provider a list of all the medicines, herbs, non-prescription drugs, or dietary supplements you use. Also tell them if you smoke, drink alcohol, or use illegal drugs. Some items may interact with your medicine. What should I watch for while using this medicine? Report any side effects that you notice during your treatment right away, such as changes in your breathing, fever, chills, dizziness or lightheadedness. These effects are more common with the first dose. Visit your prescriber or health care professional for checks on your progress. You will need to have regular blood work. Report any other side effects. The side effects of this medicine can continue after you finish your treatment. Continue your course of treatment even though you feel ill unless your doctor tells you to stop. Call your doctor or health care professional for advice if you get a fever, chills or sore throat, or other symptoms of a cold or flu. Do not treat yourself. This drug decreases your body's ability to fight infections. Try to avoid being around people who are sick. This medicine may increase your risk to bruise or bleed. Call your doctor or health care professional if you notice any unusual bleeding. Be careful brushing and flossing your teeth or using a toothpick because you may get an infection or bleed more easily. If you have any dental work done, tell your dentist you are receiving this medicine. Avoid taking products   that contain aspirin, acetaminophen, ibuprofen, naproxen, or ketoprofen unless instructed by your doctor. These medicines may hide a fever. Do not become pregnant while taking this medicine. Women should inform their doctor  if they wish to become pregnant or think they might be pregnant. There is a potential for serious side effects to an unborn child. Talk to your health care professional or pharmacist for more information. Do not breast-feed an infant while taking this medicine. What side effects may I notice from receiving this medicine? Side effects that you should report to your doctor or health care professional as soon as possible: -allergic reactions like skin rash, itching or hives, swelling of the face, lips, or tongue -low blood counts - this medicine may decrease the number of white blood cells, red blood cells and platelets. You may be at increased risk for infections and bleeding. -signs of infection - fever or chills, cough, sore throat, pain or difficulty passing urine -signs of decreased platelets or bleeding - bruising, pinpoint red spots on the skin, black, tarry stools, blood in the urine -signs of decreased red blood cells - unusually weak or tired, fainting spells, lightheadedness -breathing problems -confused, not responsive -chest pain -fast, irregular heartbeat -feeling faint or lightheaded, falls -mouth sores -redness, blistering, peeling or loosening of the skin, including inside the mouth -stomach pain -swelling of the ankles, feet, or hands -trouble passing urine or change in the amount of urine Side effects that usually do not require medical attention (report to your doctor or other health care professional if they continue or are bothersome): -anxiety -headache -loss of appetite -muscle aches -nausea -night sweats This list may not describe all possible side effects. Call your doctor for medical advice about side effects. You may report side effects to FDA at 1-800-FDA-1088. Where should I keep my medicine? This drug is given in a hospital or clinic and will not be stored at home. NOTE: This sheet is a summary. It may not cover all possible information. If you have questions  about this medicine, talk to your doctor, pharmacist, or health care provider.  2014, Elsevier/Gold Standard. (2008-05-05 14:04:59) Bendamustine Injection What is this medicine? BENDAMUSTINE (BEN da MUS teen) is a chemotherapy drug. It is used to treat chronic lymphocytic leukemia and non-Hodgkin lymphoma. This medicine may be used for other purposes; ask your health care provider or pharmacist if you have questions. COMMON BRAND NAME(S): Treanda What should I tell my health care provider before I take this medicine? They need to know if you have any of these conditions: -kidney disease -liver disease -an unusual or allergic reaction to bendamustine, mannitol, other medicines, foods, dyes, or preservatives -pregnant or trying to get pregnant -breast-feeding How should I use this medicine? This medicine is for infusion into a vein. It is given by a health care professional in a hospital or clinic setting. Talk to your pediatrician regarding the use of this medicine in children. Special care may be needed. Overdosage: If you think you have taken too much of this medicine contact a poison control center or emergency room at once. NOTE: This medicine is only for you. Do not share this medicine with others. What if I miss a dose? It is important not to miss your dose. Call your doctor or health care professional if you are unable to keep an appointment. What may interact with this medicine? Do not take this medicine with any of the following medications: -clozapine This medicine may also interact with the following medications: -  atazanavir -cimetidine -ciprofloxacin -enoxacin -fluvoxamine -medicines for seizures like carbamazepine and phenobarbital -mexiletine -rifampin -tacrine -thiabendazole -zileuton This list may not describe all possible interactions. Give your health care provider a list of all the medicines, herbs, non-prescription drugs, or dietary supplements you use. Also  tell them if you smoke, drink alcohol, or use illegal drugs. Some items may interact with your medicine. What should I watch for while using this medicine? Your condition will be monitored carefully while you are receiving this medicine. This drug may make you feel generally unwell. This is not uncommon, as chemotherapy can affect healthy cells as well as cancer cells. Report any side effects. Continue your course of treatment even though you feel ill unless your doctor tells you to stop. Call your doctor or health care professional for advice if you get a fever, chills or sore throat, or other symptoms of a cold or flu. Do not treat yourself. This drug decreases your body's ability to fight infections. Try to avoid being around people who are sick. This medicine may increase your risk to bruise or bleed. Call your doctor or health care professional if you notice any unusual bleeding. Be careful brushing and flossing your teeth or using a toothpick because you may get an infection or bleed more easily. If you have any dental work done, tell your dentist you are receiving this medicine. Avoid taking products that contain aspirin, acetaminophen, ibuprofen, naproxen, or ketoprofen unless instructed by your doctor. These medicines may hide a fever. Do not become pregnant while taking this medicine. Women should inform their doctor if they wish to become pregnant or think they might be pregnant. There is a potential for serious side effects to an unborn child. Men should inform their doctors if they wish to father a child. This medicine may lower sperm counts. Talk to your health care professional or pharmacist for more information. Do not breast-feed an infant while taking this medicine. What side effects may I notice from receiving this medicine? Side effects that you should report to your doctor or health care professional as soon as possible: -allergic reactions like skin rash, itching or hives, swelling  of the face, lips, or tongue -low blood counts - this medicine may decrease the number of white blood cells, red blood cells and platelets. You may be at increased risk for infections and bleeding. -signs of infection - fever or chills, cough, sore throat, pain or difficulty passing urine -signs of decreased platelets or bleeding - bruising, pinpoint red spots on the skin, black, tarry stools, blood in the urine -signs of decreased red blood cells - unusually weak or tired, fainting spells, lightheadedness -trouble passing urine or change in the amount of urine Side effects that usually do not require medical attention (report to your doctor or health care professional if they continue or are bothersome): -diarrhea This list may not describe all possible side effects. Call your doctor for medical advice about side effects. You may report side effects to FDA at 1-800-FDA-1088. Where should I keep my medicine? This drug is given in a hospital or clinic and will not be stored at home. NOTE: This sheet is a summary. It may not cover all possible information. If you have questions about this medicine, talk to your doctor, pharmacist, or health care provider.  2014, Elsevier/Gold Standard. (2011-12-02 14:15:47)  

## 2013-11-19 ENCOUNTER — Encounter (HOSPITAL_BASED_OUTPATIENT_CLINIC_OR_DEPARTMENT_OTHER): Payer: Self-pay | Admitting: HEMATOLOGY/ONCOLOGY

## 2013-11-19 ENCOUNTER — Other Ambulatory Visit (HOSPITAL_BASED_OUTPATIENT_CLINIC_OR_DEPARTMENT_OTHER): Payer: Self-pay | Admitting: Medical

## 2013-11-19 ENCOUNTER — Telehealth (HOSPITAL_BASED_OUTPATIENT_CLINIC_OR_DEPARTMENT_OTHER): Payer: Self-pay | Admitting: HEMATOLOGY/ONCOLOGY

## 2013-11-19 MED ORDER — ONDANSETRON HCL 8 MG TABLET
8.00 mg | ORAL_TABLET | Freq: Three times a day (TID) | ORAL | Status: DC | PRN
Start: 2013-11-19 — End: 2017-02-06

## 2013-11-19 MED ORDER — PROMETHAZINE 25 MG TABLET
25.00 mg | ORAL_TABLET | Freq: Four times a day (QID) | ORAL | Status: DC | PRN
Start: 2013-11-19 — End: 2017-02-06

## 2013-11-19 NOTE — Telephone Encounter (Signed)
Call to the patient with his appointment for chemotherapy on 11/25/13.  He verbalized understanding.  He was also notified that Maudie Mercury e-scripted antiemetic medications for him and states that he will pick them up at his pharmacy.-P. Perrin Smack, RN

## 2013-11-19 NOTE — Telephone Encounter (Signed)
Zofran and phenergan to [facility] in [place], PA-C

## 2013-11-21 ENCOUNTER — Other Ambulatory Visit (HOSPITAL_BASED_OUTPATIENT_CLINIC_OR_DEPARTMENT_OTHER): Payer: Self-pay

## 2013-11-21 ENCOUNTER — Encounter (HOSPITAL_BASED_OUTPATIENT_CLINIC_OR_DEPARTMENT_OTHER): Payer: Self-pay | Admitting: HEMATOLOGY/ONCOLOGY

## 2013-11-21 DIAGNOSIS — C8590 Non-Hodgkin lymphoma, unspecified, unspecified site: Secondary | ICD-10-CM

## 2013-11-21 DIAGNOSIS — R002 Palpitations: Secondary | ICD-10-CM

## 2013-11-22 ENCOUNTER — Encounter (INDEPENDENT_AMBULATORY_CARE_PROVIDER_SITE_OTHER): Payer: BC Managed Care – PPO | Admitting: Surgery

## 2013-11-22 ENCOUNTER — Inpatient Hospital Stay (HOSPITAL_BASED_OUTPATIENT_CLINIC_OR_DEPARTMENT_OTHER): Payer: BC Managed Care – PPO

## 2013-11-22 ENCOUNTER — Ambulatory Visit (HOSPITAL_BASED_OUTPATIENT_CLINIC_OR_DEPARTMENT_OTHER): Payer: Self-pay | Admitting: HEMATOLOGY/ONCOLOGY

## 2013-11-22 DIAGNOSIS — D509 Iron deficiency anemia, unspecified: Secondary | ICD-10-CM

## 2013-11-22 NOTE — Telephone Encounter (Signed)
Message copied by Cydney Ok on Fri Nov 22, 2013 12:11 PM  ------       Message from: Venia Minks       Created: Fri Nov 22, 2013  9:36 AM         >> Venia Minks 11/22/2013 09:36 AM       Cecilie Lowers pt.              Pt called and said he decided [place]. You can call him with any questions. I went ahead and cancelled his upcoming appt and notified the infusion center.              Thank you  ------

## 2013-11-25 ENCOUNTER — Inpatient Hospital Stay (HOSPITAL_BASED_OUTPATIENT_CLINIC_OR_DEPARTMENT_OTHER): Admission: RE | Admit: 2013-11-25 | Payer: BC Managed Care – PPO | Source: Ambulatory Visit

## 2013-11-26 ENCOUNTER — Inpatient Hospital Stay (HOSPITAL_BASED_OUTPATIENT_CLINIC_OR_DEPARTMENT_OTHER): Payer: BC Managed Care – PPO

## 2013-11-26 ENCOUNTER — Ambulatory Visit: Payer: BC Managed Care – PPO | Attending: Otolaryngology | Admitting: Otolaryngology

## 2013-11-26 VITALS — BP 142/90 | HR 91 | Temp 98.3°F | Ht 71.0 in | Wt 271.6 lb

## 2013-11-26 DIAGNOSIS — C8589 Other specified types of non-Hodgkin lymphoma, extranodal and solid organ sites: Secondary | ICD-10-CM | POA: Insufficient documentation

## 2013-11-26 DIAGNOSIS — J45909 Unspecified asthma, uncomplicated: Secondary | ICD-10-CM

## 2013-11-26 DIAGNOSIS — C8590 Non-Hodgkin lymphoma, unspecified, unspecified site: Secondary | ICD-10-CM

## 2013-11-26 NOTE — Progress Notes (Addendum)
PATIENT NAME:  Jason Ball  MRN:  [id number]  DOB:    DATE OF SERVICE: 11/26/2013    HPI:  Odes Lolli is a 53 y.o. year old male presenting for follow up after biopsy of left sinonasal tumor a month ago which came back as NHL. He is starting chemotherapy in [facility] next week. No nasal complaints. No bleeding or congestion.     Physical Exam:  Blood pressure 142/90, pulse 91, temperature 36.8 C (98.3 F), height 1.803 m (5\' 11" ), weight 123.2 kg (271 lb 9.7 oz).  Body mass index is 37.9 kg/(m^2).  General Appearance: Pleasant, cooperative, healthy, and in no acute distress.  Eyes: Conjunctivae/corneas clear  Head and Face: Normocephalic, atraumatic.  Face symmetric, no obvious lesions.   Nose:  External pyramid midline. Bid inferior turbinates. Mucosa normal.   Psychiatric:  Alert and oriented x 3.      Assessment:  Left sinonasal and systemic NHL  Starting chemotherapy next week in [place]:  Follow up as needed  Can blow his nose      Captain Blucher A. Wilder Glade, MD  PGY-5 resident  Department of Otolaryngology, [place]    I saw and examined the patient.  I reviewed the resident's note.  I agree with the findings and plan of care as documented in the resident's note.  Any exceptions/additions are edited/noted.    Barbette Merino Ramadan, MD 11/26/2013, 16:59    Sheryle Hail Ramadan, MD    PCP:  Dolores Lory, DO  [address]  [place] [address]   REF:  Janae Sauce, [place]  [place], [place] [address]

## 2013-11-27 ENCOUNTER — Other Ambulatory Visit: Payer: Self-pay | Admitting: Surgery

## 2013-11-27 DIAGNOSIS — D509 Iron deficiency anemia, unspecified: Secondary | ICD-10-CM

## 2013-11-27 DIAGNOSIS — K259 Gastric ulcer, unspecified as acute or chronic, without hemorrhage or perforation: Secondary | ICD-10-CM

## 2013-11-27 DIAGNOSIS — K2940 Chronic atrophic gastritis without bleeding: Secondary | ICD-10-CM

## 2013-12-11 ENCOUNTER — Encounter (INDEPENDENT_AMBULATORY_CARE_PROVIDER_SITE_OTHER): Payer: Self-pay | Admitting: Surgery

## 2013-12-12 ENCOUNTER — Inpatient Hospital Stay (HOSPITAL_BASED_OUTPATIENT_CLINIC_OR_DEPARTMENT_OTHER): Payer: BC Managed Care – PPO | Admitting: HEMATOLOGY/ONCOLOGY

## 2013-12-12 ENCOUNTER — Inpatient Hospital Stay (HOSPITAL_BASED_OUTPATIENT_CLINIC_OR_DEPARTMENT_OTHER): Payer: BC Managed Care – PPO

## 2013-12-13 ENCOUNTER — Inpatient Hospital Stay (HOSPITAL_BASED_OUTPATIENT_CLINIC_OR_DEPARTMENT_OTHER): Payer: BC Managed Care – PPO

## 2013-12-16 ENCOUNTER — Inpatient Hospital Stay (HOSPITAL_BASED_OUTPATIENT_CLINIC_OR_DEPARTMENT_OTHER): Payer: BC Managed Care – PPO

## 2013-12-16 ENCOUNTER — Inpatient Hospital Stay (HOSPITAL_BASED_OUTPATIENT_CLINIC_OR_DEPARTMENT_OTHER): Payer: BC Managed Care – PPO | Admitting: HEMATOLOGY/ONCOLOGY

## 2013-12-17 ENCOUNTER — Inpatient Hospital Stay (HOSPITAL_BASED_OUTPATIENT_CLINIC_OR_DEPARTMENT_OTHER): Payer: BC Managed Care – PPO

## 2014-01-08 ENCOUNTER — Ambulatory Visit (HOSPITAL_BASED_OUTPATIENT_CLINIC_OR_DEPARTMENT_OTHER): Payer: Self-pay

## 2015-08-31 DIAGNOSIS — J3489 Other specified disorders of nose and nasal sinuses: Secondary | ICD-10-CM

## 2015-08-31 DIAGNOSIS — E785 Hyperlipidemia, unspecified: Secondary | ICD-10-CM

## 2015-08-31 DIAGNOSIS — M62838 Other muscle spasm: Secondary | ICD-10-CM

## 2015-08-31 DIAGNOSIS — I10 Essential (primary) hypertension: Secondary | ICD-10-CM

## 2015-10-16 DIAGNOSIS — M62838 Other muscle spasm: Secondary | ICD-10-CM

## 2015-10-16 DIAGNOSIS — J209 Acute bronchitis, unspecified: Secondary | ICD-10-CM

## 2015-10-16 DIAGNOSIS — I10 Essential (primary) hypertension: Secondary | ICD-10-CM

## 2015-10-16 DIAGNOSIS — E785 Hyperlipidemia, unspecified: Secondary | ICD-10-CM

## 2016-05-31 ENCOUNTER — Ambulatory Visit: Payer: BC Managed Care – PPO | Attending: Family Medicine

## 2016-05-31 ENCOUNTER — Ambulatory Visit (HOSPITAL_COMMUNITY): Payer: BC Managed Care – PPO | Admitting: Family Medicine

## 2016-05-31 DIAGNOSIS — N390 Urinary tract infection, site not specified: Secondary | ICD-10-CM

## 2016-05-31 DIAGNOSIS — Z87442 Personal history of urinary calculi: Secondary | ICD-10-CM

## 2016-06-01 LAB — URINALYSIS, MICROSCOPIC

## 2016-06-01 LAB — URINALYSIS, MACROSCOPIC
GLUCOSE: NEGATIVE mg/dL
KETONES: NEGATIVE mg/dL
NITRITE: POSITIVE — AB
PH: 6.5 (ref 5.0–7.0)
SPECIFIC GRAVITY: 1.021 (ref 1.010–1.025)
UROBILINOGEN: 0.2 mg/dL

## 2016-06-03 LAB — URINE CULTURE: URINE CULTURE: 100000 — AB

## 2016-06-14 DIAGNOSIS — N410 Acute prostatitis: Secondary | ICD-10-CM

## 2016-06-14 DIAGNOSIS — A4151 Sepsis due to Escherichia coli [E. coli]: Secondary | ICD-10-CM

## 2016-08-05 DIAGNOSIS — R972 Elevated prostate specific antigen [PSA]: Secondary | ICD-10-CM

## 2016-08-05 DIAGNOSIS — E785 Hyperlipidemia, unspecified: Secondary | ICD-10-CM

## 2016-08-05 DIAGNOSIS — A4151 Sepsis due to Escherichia coli [E. coli]: Secondary | ICD-10-CM

## 2016-08-05 DIAGNOSIS — Z23 Encounter for immunization: Secondary | ICD-10-CM

## 2016-08-05 DIAGNOSIS — I10 Essential (primary) hypertension: Secondary | ICD-10-CM

## 2016-11-15 DIAGNOSIS — I10 Essential (primary) hypertension: Secondary | ICD-10-CM

## 2016-11-15 DIAGNOSIS — E785 Hyperlipidemia, unspecified: Secondary | ICD-10-CM

## 2017-01-23 ENCOUNTER — Encounter (INDEPENDENT_AMBULATORY_CARE_PROVIDER_SITE_OTHER): Payer: Self-pay

## 2017-02-02 ENCOUNTER — Encounter (INDEPENDENT_AMBULATORY_CARE_PROVIDER_SITE_OTHER): Payer: Self-pay

## 2017-02-02 DIAGNOSIS — E785 Hyperlipidemia, unspecified: Secondary | ICD-10-CM

## 2017-02-02 HISTORY — DX: Hyperlipidemia, unspecified: E78.5

## 2017-02-06 ENCOUNTER — Encounter (INDEPENDENT_AMBULATORY_CARE_PROVIDER_SITE_OTHER): Payer: Self-pay | Admitting: Family Medicine

## 2017-02-06 ENCOUNTER — Ambulatory Visit (INDEPENDENT_AMBULATORY_CARE_PROVIDER_SITE_OTHER): Payer: BC Managed Care – PPO | Admitting: Family Medicine

## 2017-02-06 VITALS — BP 136/78 | HR 92 | Temp 98.2°F | Ht 71.0 in | Wt 279.0 lb

## 2017-02-06 DIAGNOSIS — Z6838 Body mass index (BMI) 38.0-38.9, adult: Secondary | ICD-10-CM

## 2017-02-06 DIAGNOSIS — E785 Hyperlipidemia, unspecified: Secondary | ICD-10-CM

## 2017-02-06 DIAGNOSIS — R739 Hyperglycemia, unspecified: Secondary | ICD-10-CM

## 2017-02-06 DIAGNOSIS — I10 Essential (primary) hypertension: Secondary | ICD-10-CM

## 2017-02-06 DIAGNOSIS — G5712 Meralgia paresthetica, left lower limb: Principal | ICD-10-CM

## 2017-02-06 NOTE — Progress Notes (Signed)
[place]  [address] [place] [address]  Dept Phone: [phone]  Dept Fax: [phone]    Goldman Birchall    [id number]    Date of Service: 02/06/2017   Chief complaint:   Chief Complaint   Patient presents with   . Left Leg Pain       Subjective:   C/o left leg/thigh pain and numbness. Sitting down it goes away. Has had for the past 4 years. More frequently occurring over the past few weeks. Now getting some swelling in both of his legs. Stopped the statin and some leg pains and cramping has improved.  Some increased swelling with bumped amlodipine. Bp has been better on  The bumped BP med.    Recent hospitalization for chest pain. Echo and MPS were both normal. Think was meal related. No issues since. His sugar level was 6.0 at [facility].     Patient Active Problem List    Diagnosis   . Hyperlipidemia   . Lymphoma ([place])   . Asthma   . Hypertension   . Palpitations   . Obesity   . Sinusitis, chronic     Social History     Social History   . Marital status: Married     Spouse name: N/A   . Number of children: 3   . Years of education: 14     Occupational History   . self employed       Social History Main Topics   . Smoking status: Never Smoker   . Smokeless tobacco: Never Used   . Alcohol use Yes      Comment: 1 beer every 3 months   . Drug use: No   . Sexual activity: Not on file     Other Topics Concern   . Ability To Walk 1 Flight Of Steps Without Sob/Cp Yes   . Routine Exercise No   . Ability To Walk 2 Flight Of Steps Without Sob/Cp Yes   . Ability To Do Own Adl's Yes   . Other Activity Level Yes     mechanic activities   . Uses Cane No   . Uses Walker No   . Uses Wheelchair No   . Right Hand Dominant Yes     Social History Narrative    Denies use of OTC substances (caffeine pills, pseudoephedrine products, dextromethorphan products, and herbal preparations). Denies past/current misuse of prescription medications (opioids, sedatives, stimulants). Denies marijuana  use. Denies past/current use of illicit drugs (cocaine, heroin, methamphetamine, hallucinogens, and inhalants).              Family Medical History     Problem Relation (Age of Onset)    Cancer Father    HTN <20 y.o. Other    Heart Disease Other    No Known Problems Brother, Son, Daughter, Son    Other Mother            Current Outpatient Prescriptions   Medication Sig   . amLODIPine-benazepril (LOTREL) 5-20 mg Oral Capsule Take 1 Cap by mouth Once a day   . aspirin (ECOTRIN) 81 mg Oral Tablet, Delayed Release (E.C.) Take 81 mg by mouth Once a day       ROS:  No chest pain, DOE, or palpitations. No orthopnea or PND.  No cough, sputum, or hemoptysis. No fever,chills, or night sweats.  No nausea or vomiting. No abdominal pain. No change in bowel habits  No melena or bright  red rectal bleeding.  Voiding without difficulty   No headache , diplopia or loss of function of limbs.    Objective:     BP 136/78  Pulse 92  Temp 36.8 C (98.2 F) (Oral)   Ht 1.803 m (5\' 11" )  Wt 126.6 kg (279 lb)  SpO2 96%  BMI 38.91 kg/m2  BP Readings from Last 3 Encounters:   02/06/17 136/78   11/26/13 (!) 142/90   11/18/13 (!) 141/104     Wt Readings from Last 3 Encounters:   02/06/17 126.6 kg (279 lb)   11/26/13 123.2 kg (271 lb 9.7 oz)   11/18/13 124 kg (273 lb 5.9 oz)     General appearance: alert, oriented x 3, in his normal state, cooperative, not in apparent distress, appearing stated age   4:  Eyes:  Pupils equal round react like accommodation extraocular muscles intact. Ears within normal limits throat clear, tonsils normal, no cervical lymphadenopathy thyroid normal  Lungs: clear to auscultation bilaterally, respirations non-labored  Heart: regular rate and rhythm, S1, S2 normal, no murmur, PMI non-diffuse  Abdomen: soft, non-tender. Bowel sounds normal.  Extremities: extremities normal, atraumatic, no cyanosis or edema, pulses intact in upper and lower extremities    Assessment and Plan     Wyeth was seen today for left leg  pain.    Diagnoses and all orders for this visit:    Meralgia paresthetica of left side  Will watch for now. Needs to continue to work on weight. Consider MRI of L/S spine if worsens.  Essential hypertension  Good on current med. Consider off the amlodipine to help with swelling.   Hyperlipidemia, unspecified hyperlipidemia type  Will stay off the statin at present and see how weight reductrion helps over the next 3 months  -     COMPREHENSIVE METABOLIC PNL, FASTING; Future  -     LIPID PANEL; Future    Hyperglycemia  Discussed diet and exercise. Need to get weight down. A1c was 6.0 at Mon.   -     HGA1C (HEMOGLOBIN A1C WITH EST AVG GLUCOSE); Future      BMI addressed: Advised on diet, weight loss, and exercise to reduce above normal BMI.            Orders Placed This Encounter   . HGA1C (HEMOGLOBIN A1C WITH EST AVG GLUCOSE)   . COMPREHENSIVE METABOLIC PNL, FASTING   . LIPID PANEL       S. "Paula Compton, D.O.

## 2017-02-21 ENCOUNTER — Encounter (INDEPENDENT_AMBULATORY_CARE_PROVIDER_SITE_OTHER): Payer: Self-pay | Admitting: Family Medicine

## 2017-06-19 ENCOUNTER — Other Ambulatory Visit (INDEPENDENT_AMBULATORY_CARE_PROVIDER_SITE_OTHER): Payer: Self-pay | Admitting: Family Medicine

## 2017-07-20 ENCOUNTER — Encounter (HOSPITAL_COMMUNITY): Payer: Self-pay | Admitting: Emergency Medicine

## 2017-07-20 DIAGNOSIS — Z856 Personal history of leukemia: Secondary | ICD-10-CM | POA: Diagnosis not present

## 2017-07-20 DIAGNOSIS — R319 Hematuria, unspecified: Secondary | ICD-10-CM | POA: Diagnosis present

## 2017-07-20 DIAGNOSIS — N201 Calculus of ureter: Secondary | ICD-10-CM | POA: Diagnosis not present

## 2017-07-20 DIAGNOSIS — I10 Essential (primary) hypertension: Secondary | ICD-10-CM | POA: Insufficient documentation

## 2017-07-20 DIAGNOSIS — Z5321 Procedure and treatment not carried out due to patient leaving prior to being seen by health care provider: Secondary | ICD-10-CM

## 2017-07-20 LAB — BASIC METABOLIC PANEL
ANION GAP: 10 (ref 5–15)
BUN: 19 mg/dL (ref 6–20)
CHLORIDE: 105 mmol/L (ref 101–111)
CO2: 23 mmol/L (ref 22–32)
Calcium: 9 mg/dL (ref 8.9–10.3)
Creatinine, Ser: 1.45 mg/dL — ABNORMAL HIGH (ref 0.61–1.24)
GFR calc Af Amer: >60 mL/min (ref >60)
GFR, EST NON AFRICAN AMERICAN: 53 mL/min — AB (ref >60)
GLUCOSE: 120 mg/dL — AB (ref 65–99)
POTASSIUM: 4 mmol/L (ref 3.5–5.1)
Sodium: 138 mmol/L (ref 135–145)

## 2017-07-20 LAB — CBC
HEMATOCRIT: 41.7 % (ref 39.0–52.0)
HEMOGLOBIN: 13.9 g/dL (ref 13.0–17.0)
MCH: 29.5 pg (ref 26.0–34.0)
MCHC: 33.3 g/dL (ref 30.0–36.0)
MCV: 88.5 fL (ref 78.0–100.0)
Platelets: 331 10*3/uL (ref 150–400)
RBC: 4.71 MIL/uL (ref 4.22–5.81)
RDW: 13.8 % (ref 11.5–15.5)
WBC: 11.2 10*3/uL — ABNORMAL HIGH (ref 4.0–10.5)

## 2017-07-20 LAB — URINALYSIS, ROUTINE W REFLEX MICROSCOPIC
Bilirubin Urine: NEGATIVE
GLUCOSE, UA: NEGATIVE mg/dL
HGB URINE DIPSTICK: NEGATIVE
Ketones, ur: NEGATIVE mg/dL
NITRITE: NEGATIVE
PH: 5 (ref 5.0–8.0)
Protein, ur: NEGATIVE mg/dL
SPECIFIC GRAVITY, URINE: 1.014 (ref 1.005–1.030)

## 2017-07-20 MED ORDER — OXYCODONE-ACETAMINOPHEN 5-325 MG PO TABS
1.0000 | ORAL_TABLET | Freq: Once | ORAL | Status: AC
  Administered 2017-07-20: 1 via ORAL

## 2017-07-20 MED ORDER — OXYCODONE-ACETAMINOPHEN 5-325 MG PO TABS
ORAL_TABLET | ORAL | Status: AC
  Filled 2017-07-20: qty 1

## 2017-07-20 NOTE — ED Triage Notes (Signed)
Pt c/o 10/10 right flank pain that started today, pt is on PO Cipro for a UTI but today he starting having this sharp pain.

## 2017-07-20 NOTE — ED Triage Notes (Signed)
Called for triage X1 

## 2017-07-21 ENCOUNTER — Emergency Department (HOSPITAL_COMMUNITY)
Admission: EM | Admit: 2017-07-21 | Discharge: 2017-07-21 | Disposition: A | Discharge status: Home / Self Care | Payer: BLUE CROSS/BLUE SHIELD | Attending: Emergency Medicine | Admitting: Emergency Medicine

## 2017-07-21 ENCOUNTER — Encounter (HOSPITAL_COMMUNITY): Payer: Self-pay | Admitting: Emergency Medicine

## 2017-07-21 ENCOUNTER — Emergency Department (HOSPITAL_COMMUNITY): Payer: BLUE CROSS/BLUE SHIELD

## 2017-07-21 ENCOUNTER — Emergency Department (HOSPITAL_COMMUNITY)
Admission: EM | Admit: 2017-07-21 | Discharge: 2017-07-21 | Disposition: A | Discharge status: Home / Self Care | Payer: BLUE CROSS/BLUE SHIELD | Source: Home / Self Care

## 2017-07-21 DIAGNOSIS — N201 Calculus of ureter: Secondary | ICD-10-CM

## 2017-07-21 DIAGNOSIS — R110 Nausea: Secondary | ICD-10-CM

## 2017-07-21 HISTORY — DX: Malignant (primary) neoplasm, unspecified: C80.1

## 2017-07-21 HISTORY — DX: Inflammatory disease of prostate, unspecified: N41.9

## 2017-07-21 HISTORY — DX: Essential (primary) hypertension: I10

## 2017-07-21 MED ORDER — OXYCODONE-ACETAMINOPHEN 5-325 MG PO TABS
1.0000 | ORAL_TABLET | Freq: Once | ORAL | Status: AC
  Administered 2017-07-21: 1 via ORAL
  Filled 2017-07-21: qty 1

## 2017-07-21 MED ORDER — TAMSULOSIN HCL 0.4 MG PO CAPS
0.4000 mg | ORAL_CAPSULE | Freq: Once | ORAL | Status: AC
  Administered 2017-07-21: 0.4 mg via ORAL
  Filled 2017-07-21: qty 1

## 2017-07-21 MED ORDER — TAMSULOSIN HCL 0.4 MG PO CAPS: 0.4000 mg | ORAL_CAPSULE | Freq: Every day | ORAL | 0 refills | Status: AC

## 2017-07-21 MED ORDER — HYDROMORPHONE HCL 1 MG/ML IJ SOLN
1.0000 mg | Freq: Once | INTRAMUSCULAR | Status: AC
  Administered 2017-07-21: 1 mg via INTRAVENOUS
  Filled 2017-07-21: qty 1

## 2017-07-21 MED ORDER — ONDANSETRON 4 MG PO TBDP: 4.0000 mg | ORAL_TABLET | Freq: Three times a day (TID) | ORAL | 0 refills | Status: AC | PRN

## 2017-07-21 MED ORDER — OXYCODONE-ACETAMINOPHEN 5-325 MG PO TABS: 1.0000 | ORAL_TABLET | ORAL | 0 refills | Status: AC | PRN

## 2017-07-21 MED ORDER — ONDANSETRON HCL 4 MG/2ML IJ SOLN
4.0000 mg | Freq: Once | INTRAMUSCULAR | Status: AC
  Administered 2017-07-21: 4 mg via INTRAVENOUS
  Filled 2017-07-21: qty 2

## 2017-07-21 NOTE — ED Notes (Signed)
Patient had blood drawn around 2122 and has resulted from [facility].

## 2017-07-21 NOTE — ED Triage Notes (Signed)
Patient complaining of blood in urine. Patient states it has been several years ago that he has had this problem. Patient was diagnosed with uti two days ago. Patient states that he thinks more is going on due to increased pain.+

## 2017-07-21 NOTE — ED Notes (Addendum)
Pt coming up to the nurses station multiple times complaining of pain, stating that the medicine that was given to him in triage was not helping. Offered the patient to be reevaluated and advised him to stay to be seen but the patient chose to leave and go to another hospital. RN in triage made aware.

## 2017-07-21 NOTE — Discharge Instructions (Signed)
Take the prescribed medication as directed. Follow-up with urology-- can call to make an appt. Return to the ED for new or worsening symptoms.

## 2017-07-21 NOTE — ED Provider Notes (Signed)
[place] DEPT Provider Note   CSN: [id number] Arrival date & time: 07/21/17  0038     History   Chief Complaint Chief Complaint  Patient presents with  . Hematuria    HPI Peter Atkins is a 56 y.o. male.  The history is provided by the patient and medical records.  Hematuria  Associated symptoms include abdominal pain.    56 year old male with history of hypertension, prostatitis, kidney stones, history of non-Hodgkin's lymphoma and in remission, presenting to the ED with hematuria and right flank pain.  Reports 3 days ago he was started on ciprofloxacin for UTI.  He states he is urinating better at this time, however he has had worsening pain in his right flank.  States pain seems to radiate around from his right lower abdomen into the back, occasionally into the groin and buttocks.  Denies testicle pain/swelling.  States some parts of this feel like a kidney stone, other parts feel like when he had prostatitis in the past.  Son does report he was hospitalized last year for urosepsis with obstruction from bad case of prostatitis.  He does not follow with urology regularly.  Has no had his prostate checked in over a year.  States he did go to [facility] yesterday evening and waited in the waiting room there for 7 hours.  He had labs and urinalysis done there.  He denies any fever or chills.  Has had some intermittent nausea but denies vomiting.  Has been able to drink small amounts of fluid.  Past Medical History:  Diagnosis Date  . Cancer ([place])   . Hypertension   . Prostatitis     There are no active problems to display for this patient.   History reviewed. No pertinent surgical history.     Home Medications    Prior to Admission medications   Not on File    Family History History reviewed. No pertinent family history.  Social History Social History  Substance Use Topics  . Smoking status: Never Smoker  . Smokeless tobacco: Never  Used  . Alcohol use No     Allergies   Patient has no known allergies.   Review of Systems Review of Systems  Gastrointestinal: Positive for abdominal pain and nausea.  Genitourinary: Positive for flank pain and hematuria.  All other systems reviewed and are negative.    Physical Exam Updated Vital Signs BP (!) 154/120 (BP Location: Left Arm)   Pulse 79   Temp 98 F (36.7 C) (Oral)   Resp 20   Ht 5' 11.5" (1.816 m)   Wt 121.6 kg (268 lb)   SpO2 98%   BMI 36.86 kg/m   Physical Exam  Constitutional: He is oriented to person, place, and time. He appears well-developed and well-nourished.  Appears uncomfortable, writhing around in bed  HENT:  Head: Normocephalic and atraumatic.  Mouth/Throat: Oropharynx is clear and moist.  Eyes: Pupils are equal, round, and reactive to light. Conjunctivae and EOM are normal.  Neck: Normal range of motion.  Cardiovascular: Normal rate, regular rhythm and normal heart sounds.   Pulmonary/Chest: Effort normal and breath sounds normal. No respiratory distress. He has no wheezes.  Abdominal: Soft. Bowel sounds are normal. There is tenderness in the right lower quadrant. There is CVA tenderness.  Tenderness in the right lower quadrant and right CVA, normal bowel sounds, no distention  Musculoskeletal: Normal range of motion.  Neurological: He is alert and oriented to person, place, and time.  Skin: Skin is warm and dry.  Psychiatric: He has a normal mood and affect.  Nursing note and vitals reviewed.    ED Treatments / Results  Labs (all labs ordered are listed, but only abnormal results are displayed) Labs Reviewed - No data to display  EKG  EKG Interpretation None       Radiology Ct Renal Stone Study  Result Date: 07/21/2017 CLINICAL DATA:  56 year old male with right flank pain. EXAM: CT ABDOMEN AND PELVIS WITHOUT CONTRAST TECHNIQUE: Multidetector CT imaging of the abdomen and pelvis was performed following the standard  protocol without IV contrast. COMPARISON:  None. FINDINGS: Evaluation of this exam is limited in the absence of intravenous contrast. Lower chest: The visualized lung bases are clear. No intra-abdominal free air or free fluid. Hepatobiliary: There is diffuse fatty infiltration of the liver. No intrahepatic biliary ductal dilatation. The gallbladder is physiologically distended and appears unremarkable. Pancreas: Unremarkable. No pancreatic ductal dilatation or surrounding inflammatory changes. Spleen: Normal in size without focal abnormality. Adrenals/Urinary Tract: The adrenal glands are unremarkable. There is non rotation of the right kidney. There is a 4 mm distal right ureteral calculus with mild right hydronephrosis. There are 2 nonobstructing stone in the inferior pole of the left kidney measuring up to 4 mm. There is no hydronephrosis on the left. Mild thickened and trabecular appearance of the bladder wall may be related to chronic bladder outlet obstruction. Correlation with urinalysis recommended to exclude UTI. Stomach/Bowel: There small scattered sigmoid diverticula without active inflammatory changes. There is no evidence of bowel obstruction or active inflammation. The appendix is normal. Vascular/Lymphatic: Minimal aortoiliac atherosclerotic disease. The abdominal aorta and IVC are otherwise unremarkable on this noncontrast CT. No portal venous gas identified. Mildly enlarged bilateral iliac chain lymph nodes measuring up to 18 mm in short axis on the right, of indeterminate clinical significance, possibly reactive. Clinical correlation is recommended. Reproductive: The prostate and seminal vesicles are grossly unremarkable. Other: None Musculoskeletal: Mild degenerative changes of the spine. L5-S1 disc desiccation with vacuum phenomena. No acute osseous pathology. IMPRESSION: 1. A 4 mm distal right ureteral calculus with mild right hydronephrosis. Small nonobstructing left renal calculi. No  hydronephrosis on the left. 2. Fatty liver. 3. Small colonic diverticulosis. No bowel obstruction or active inflammation. Electronically Signed   By: Anner Crete M.D.   On: 07/21/2017 02:42    Procedures Procedures (including critical care time)  Medications Ordered in ED Medications - No data to display   Initial Impression / Assessment and Plan / ED Course  I have reviewed the triage vital signs and the nursing notes.  Pertinent labs & imaging results that were available during my care of the patient were reviewed by me and considered in my medical decision making (see chart for details).  56 year old male here with hematuria and right flank pain.  Reports he was diagnosed with UTI and started on ciprofloxacin 3 days ago.  Reports his pain has worsened but his urinary stream has improved.  He is afebrile and nontoxic but does appear uncomfortable.  Has significant right lower abdominal tenderness extending to right CVA.  Denies any testicle pain or scrotal swelling.  No discharge.  Labs reviewed from [facility] earlier yesterday, overall reassuring.  UA with large leks, rare bacteria, no blood.  CBC with WBC count 11.2, otherwise normal.  SrCr 1.45-- last on record through care everywhere was over 3 years ago 0.9 at that time.  Will obtain CT renal study to evaluation.  CT  scan reveals 4 mm distal right ureteral calculus with mild hydronephrosis.  Prostate grossly appears normal.  Patient is feeling better after medications here.  We have discussed his CT results and follow-up.  His UA did reveal leukocytes, rare bacteria.  Given that he has a known stone, we will have him continue his ciprofloxacin.  We will add Percocet, Zofran, Flomax.  We discussed follow-up with urology.  He understands to return here for any new or worsening symptoms.  Final Clinical Impressions(s) / ED Diagnoses   Final diagnoses:  Right ureteral stone  Nausea    New Prescriptions New Prescriptions    ONDANSETRON (ZOFRAN ODT) 4 MG DISINTEGRATING TABLET    Take 1 tablet (4 mg total) by mouth every 8 (eight) hours as needed for nausea.   OXYCODONE-ACETAMINOPHEN (PERCOCET) 5-325 MG TABLET    Take 1 tablet by mouth every 4 (four) hours as needed.   TAMSULOSIN (FLOMAX) 0.4 MG CAPS CAPSULE    Take 1 capsule (0.4 mg total) by mouth daily after supper.     Larene Pickett, PA-C 05/69/79 [id number]    Delora Fuel, MD 65/53/74 [id number]

## 2017-07-24 ENCOUNTER — Telehealth (INDEPENDENT_AMBULATORY_CARE_PROVIDER_SITE_OTHER): Payer: Self-pay | Admitting: Family Medicine

## 2017-07-24 NOTE — Telephone Encounter (Signed)
Spoke with pt today, he was out of town and came back home went to ER, is passing kidney stone,  Is on Percocet and is having issues with contipation,  Hasn't went for 4 days, wants to know what you suggest  JLS

## 2017-07-24 NOTE — Telephone Encounter (Signed)
Discussed with pt and done [place], [place]  07/24/2017, 09:08

## 2017-07-24 NOTE — Telephone Encounter (Signed)
Colace 100mg  twise a day. While on the percocet.

## 2017-07-25 ENCOUNTER — Other Ambulatory Visit (INDEPENDENT_AMBULATORY_CARE_PROVIDER_SITE_OTHER): Payer: Self-pay | Admitting: Family Medicine

## 2017-07-25 DIAGNOSIS — N200 Calculus of kidney: Secondary | ICD-10-CM

## 2017-07-26 ENCOUNTER — Encounter (HOSPITAL_BASED_OUTPATIENT_CLINIC_OR_DEPARTMENT_OTHER): Payer: Self-pay | Admitting: Urology

## 2017-07-26 ENCOUNTER — Ambulatory Visit: Payer: BC Managed Care – PPO | Attending: Family Medicine | Admitting: Urology

## 2017-07-26 ENCOUNTER — Encounter (INDEPENDENT_AMBULATORY_CARE_PROVIDER_SITE_OTHER): Payer: Self-pay | Admitting: Family Medicine

## 2017-07-26 ENCOUNTER — Encounter (HOSPITAL_COMMUNITY): Payer: Self-pay

## 2017-07-26 DIAGNOSIS — Z6837 Body mass index (BMI) 37.0-37.9, adult: Secondary | ICD-10-CM

## 2017-07-26 DIAGNOSIS — N200 Calculus of kidney: Secondary | ICD-10-CM | POA: Insufficient documentation

## 2017-07-26 DIAGNOSIS — R109 Unspecified abdominal pain: Secondary | ICD-10-CM

## 2017-07-26 DIAGNOSIS — Z87440 Personal history of urinary (tract) infections: Secondary | ICD-10-CM

## 2017-07-26 MED ORDER — OXYCODONE-ACETAMINOPHEN 5 MG-325 MG TABLET
1.0000 | ORAL_TABLET | ORAL | 0 refills | Status: DC | PRN
Start: 2017-07-26 — End: 2017-10-04

## 2017-07-26 NOTE — H&P (Signed)
Urology H&P Note     ICD-10-CM    1. Kidney stone N20.0 Refer to [facility]     oxyCODONE-acetaminophen (PERCOCET) 5-325 mg Oral Tablet     Reason for clinic visit: Right ureteral stone   History of present illness: Jason Ball is a 56 y.o. male who presents to our clinic for the first time.  Patient is referred for management of right ureteral stone. This was detected on a CT scan on 07/20/17 when patient presented to an OSH with right flank pain.  Patient reports that the stone size was reported to be 4.5 mm, we have no records to confirm this.  Still has the pain, continuously taking pain medications.   Passed a renal stone about 12 years ago.  Healthy overall. Has a history of acute prostatitis requiring admission in 2017.  Location: right ureter  Severity: moderate  Duration: 1 week  Associated symptoms: right flank pain    Past Medical History:   Diagnosis Date   . Asthma     well controlled without medications   . Blurred vision    . HTN (hypertension)    . Hyperlipidemia 02/02/2017   . Hypertension    . Lymphoma ([facility] [place])    . Obesity    . Palpitations     with anxiety   . Ringing in ears    . Sinusitis, chronic    . Tattoos     x1 left upper arm   . Upper respiratory infection     sinusitis 2 weeks ago - cleared up          Past Surgical History:   Procedure Laterality Date   . HX ADENOIDECTOMY     . HX COLONOSCOPY     . HX EYE SURGERY      eyelid lift - 2013   . HX LYMPH NODE DISSECTION     . HX TONSILLECTOMY            Allergies   Allergen Reactions   . No Known Allergies         Current Outpatient Prescriptions:   .  Amlodipine-Benazepril 10-40 mg Oral Capsule, TAKE 1 CAPSULE BY MOUTH ONCE DAILY, Disp: 30 Cap, Rfl: 5  .  aspirin (ECOTRIN) 81 mg Oral Tablet, Delayed Release (E.C.), Take 81 mg by mouth Once a day, Disp: , Rfl:   .  ciprofloxacin HCl (CIPRO) 500 mg Oral Tablet, Take 500 mg by mouth Twice daily, Disp: , Rfl:   .  ondansetron (ZOFRAN) 4 mg Oral Tablet, Take 4 mg by mouth Every 8 hours as needed  for nausea/vomiting, Disp: , Rfl:   .  oxyCODONE-acetaminophen (PERCOCET) 5-325 mg Oral Tablet, Take 1 Tab by mouth Every 4 hours as needed for Pain, Disp: 20 Tab, Rfl: 0  .  tamsulosin (FLOMAX) 0.4 mg Oral Capsule, Take 0.4 mg by mouth Every evening after dinner, Disp: , Rfl:    Family Medical History:     Problem Relation (Age of Onset)    Cancer Father    HTN <20 y.o. Other    Heart Disease Other    No Known Problems Brother, Son, Daughter, Son    Other Mother             Social History   Substance Use Topics   . Smoking status: Never Smoker   . Smokeless tobacco: Never Used   . Alcohol use Yes      Comment: 1 beer every 3 months  ROS: A comprehensive review of systems was performed. The patient denies fevers, chills, nausea, vomiting, decreased appetite, or unexpected weight loss. The rest of the review of systems was negative except as stated in the above HPI.   Physical Examination Findings:   Ht 1.803 m (5\' 11" )  Wt 121.1 kg (267 lb)  BMI 37.24 kg/m2   1. Constitutional/General: No acute distress, alert and oriented.   2. Eyes:?EOMI and sclera anicteric   3. ENT: oropharynx clear; neck supple and no lymphadenopathy   4. Resiratory: good inspiratory effort, clear bilaterally   5. Cardiac: regular rate and rhythm.   6. GI: abdomen soft, nontender, no organomegaly, no masses palpable and?nondistended   7. GU: Right flank tenderness.   8. Neuro: alert and oriented x 3, no focal neurological deficits   9. Psychiatric: normal affect and mentation   10. Skin: warm, dry, clear; no rashes or lesions   Labs:   URINE CULTURE   Date Value Ref Range Status   05/31/2016 100000 CFU/mL Escherichia coli (A)  Final     Imaging: No imaging available.   Assessment and plan:   Jason Ball is a 56 y.o. male with possible right ureteral stone. We will obtain his records from OSH.  Management options discussed. Patient reports that his symptoms are severe and he would like to have right URS/LL/stent placement as soon as  possible. We will arrange this.  ?   Submitted by: Calla Kicks MD, PhD - 07/26/2017 - 11:00

## 2017-07-28 ENCOUNTER — Ambulatory Visit (HOSPITAL_COMMUNITY): Payer: BC Managed Care – PPO | Admitting: Anesthesiology

## 2017-07-28 ENCOUNTER — Telehealth (HOSPITAL_BASED_OUTPATIENT_CLINIC_OR_DEPARTMENT_OTHER): Payer: Self-pay | Admitting: Urology

## 2017-07-28 ENCOUNTER — Encounter (HOSPITAL_COMMUNITY): Payer: Self-pay

## 2017-07-28 ENCOUNTER — Encounter (HOSPITAL_COMMUNITY)
Admission: RE | Disposition: A | Discharge status: Home / Self Care | Payer: Self-pay | Source: Ambulatory Visit | Attending: Urology

## 2017-07-28 ENCOUNTER — Ambulatory Visit (HOSPITAL_COMMUNITY): Payer: BC Managed Care – PPO | Admitting: Urology

## 2017-07-28 ENCOUNTER — Other Ambulatory Visit (HOSPITAL_COMMUNITY): Payer: Self-pay

## 2017-07-28 ENCOUNTER — Inpatient Hospital Stay
Admission: RE | Admit: 2017-07-28 | Discharge: 2017-07-28 | Disposition: A | Discharge status: Home / Self Care | Payer: BC Managed Care – PPO | Source: Ambulatory Visit | Attending: Urology | Admitting: Urology

## 2017-07-28 ENCOUNTER — Ambulatory Visit (HOSPITAL_COMMUNITY)
Admission: RE | Admit: 2017-07-28 | Discharge: 2017-07-28 | Disposition: A | Discharge status: Home / Self Care | Payer: BC Managed Care – PPO | Source: Ambulatory Visit

## 2017-07-28 ENCOUNTER — Other Ambulatory Visit (HOSPITAL_BASED_OUTPATIENT_CLINIC_OR_DEPARTMENT_OTHER): Payer: Self-pay | Admitting: Urology

## 2017-07-28 DIAGNOSIS — N201 Calculus of ureter: Secondary | ICD-10-CM

## 2017-07-28 DIAGNOSIS — N200 Calculus of kidney: Secondary | ICD-10-CM | POA: Insufficient documentation

## 2017-07-28 DIAGNOSIS — Z538 Procedure and treatment not carried out for other reasons: Secondary | ICD-10-CM | POA: Insufficient documentation

## 2017-07-28 LAB — BASIC METABOLIC PANEL
ANION GAP: 9 mmol/L
BUN/CREA RATIO: 12
BUN: 18 mg/dL (ref 10–25)
CALCIUM: 9.3 mg/dL (ref 8.2–10.2)
CHLORIDE: 100 mmol/L (ref 98–111)
CO2 TOTAL: 27 mmol/L (ref 21–35)
CREATININE: 1.46 mg/dL — ABNORMAL HIGH (ref <=1.30)
ESTIMATED GFR: 50 mL/min/1.73mˆ2 — ABNORMAL LOW
GLUCOSE: 106 mg/dL (ref 70–110)
POTASSIUM: 4.4 mmol/L (ref 3.5–5.0)
SODIUM: 136 mmol/L (ref 135–145)

## 2017-07-28 LAB — ECG 12 LEAD - ADULT
Calculated P Axis: 48 deg
Calculated T Axis: 30 deg
EKG Severity: NORMAL
Heart Rate: 80 {beats}/min
I 40 Axis: 17 deg
PR Interval: 158 ms
QRS Axis: 16 deg
QRS Duration: 96 ms
QT Interval: 377 ms
QTC Calculation: 435 ms
ST Axis: 18 deg
T 40 Axis: 13 deg

## 2017-07-28 LAB — CBC
HCT: 41.5 % (ref 38.9–50.5)
HCT: 41.5 % (ref 38.9–50.5)
HGB: 13.9 g/dL (ref 13.4–17.3)
MCH: 29.6 pg (ref 27.9–33.1)
MCHC: 33.5 g/dL (ref 32.8–36.0)
MCV: 88.3 fL (ref 82.4–95.0)
MPV: 7.4 fL (ref 6.0–10.2)
PLATELETS: 380 10*3/uL (ref 140–440)
RBC: 4.71 x10ˆ6/uL (ref 4.40–5.68)
RDW: 13.2 % (ref 10.9–15.1)
WBC: 10.2 x10ˆ3/uL — ABNORMAL HIGH (ref 3.3–9.3)

## 2017-07-28 SURGERY — URETEROSCOPY WITH HOLMIUM LASER
Anesthesia: General | Site: Ureter | Laterality: Right

## 2017-07-28 MED ORDER — SODIUM CHLORIDE 0.9 % (FLUSH) INJECTION SYRINGE
3.0000 mL | INJECTION | Freq: Three times a day (TID) | INTRAMUSCULAR | Status: DC
Start: 2017-07-28 — End: 2017-07-28

## 2017-07-28 MED ORDER — LACTATED RINGERS INTRAVENOUS SOLUTION
INTRAVENOUS | Status: DC
Start: 2017-07-28 — End: 2017-07-28

## 2017-07-28 MED ORDER — SODIUM CHLORIDE 0.9 % (FLUSH) INJECTION SYRINGE
3.0000 mL | INJECTION | INTRAMUSCULAR | Status: DC | PRN
Start: 2017-07-28 — End: 2017-07-28
  Administered 2017-07-28: 3 mL

## 2017-07-28 MED ORDER — DEXTROSE 5 % IN WATER (D5W) INTRAVENOUS SOLUTION
2.00 g | Freq: Once | INTRAVENOUS | Status: DC
Start: 2017-07-28 — End: 2017-07-28
  Filled 2017-07-28: qty 20

## 2017-07-28 SURGICAL SUPPLY — 45 items
ADAPTER INSTR UROLOK BALL FIT (UROLOGICAL SUPPLIES) IMPLANT
BAG DRAIN STRL LF  URO (UROLOGICAL SUPPLIES) ×3 IMPLANT
BAG URO STRL 5300 CS/10 (UROLOGICAL SUPPLIES) ×1
CATH URET 10FR 50CM .05IN 2 IN_J LUM TPR TIP RADOPQ STRL LF (UROLOGICAL SUPPLIES) ×1
CATH URET 10FR 54CM .05IN 2 INJ LUM RADOPQ TAPER TIP STRL LF  DISP ACPT .038IN GW (UROLOGICAL SUPPLIES) ×3
CATH URETH BARDEX LUBRICATH 18FR FOLEY 2W BAL NATURAL RUB DDRGL 5CC STRL LTX DISP (UROLOGICAL SUPPLIES) IMPLANT
CATH URETH BDX LBR 18FR FL 2W_BAL ATRM INS NTR RUB DDRGL 5CC (UROLOGICAL SUPPLIES)
CONV USE ITEM 308902 - GLOVE SURG 7.5 LF PF CLS STRL_BRN TINT 12IN PROTEXIS (GLOVES AND ACCESSORIES) ×1
CONV USE ITEM 321827 - GLOVE SURG 7.5 LF PF CLS STRL_BRN TINT 12IN PROTEXIS (GLOVES AND ACCESSORIES) ×3 IMPLANT
CRADLE POSITION ARM FOAM LMI (SUPP) ×1
CRADLE POSITION DVN ARM FOAM LMI LF (SUPP) ×3 IMPLANT
DISC USE 162466 BAG DRAINAGE 2000ML RND TRDRP_CNTR ENTRY ANRFLX DEV BCTRST (MISCELLANEOUS PT CARE ITEMS) ×4 IMPLANT
DISC USE ITEM 127681 - SOLUTION PREP POVIDINE IODINE_10% 29906004 50EA/CS (DIS) ×4
DISC USE ITEM 163322 - SYRINGE LL 20ML LTX STRL MED (NEEDLES & SYRINGE SUPPLIES) ×3 IMPLANT
GW ENDOS .035IN 145CM 5CM AMPL_TZ STR 2 FLX TP FIX COR ULTRA (WIRE)
GW URO .035IN 145CM 5CM AMPLATZ STR FIX COR 2 FLX TP STF SS PTFE URET STRL LF  DISP (WIRE) IMPLANT
GW URO .038IN 150CM 3CM SENSOR DUALFLX STR RADOPQ FLPY TIP NITINOL SS HDRPH PTFE (UROLOGICAL SUPPLIES) ×3 IMPLANT
GW URO .038IN 150CM 3CM SENSOR_DUALFLX 10CM RADOPQ KINK RST (UROLOGICAL SUPPLIES) ×1
GW URO .038IN 150CM 3CM ZIPWR ANG RADOPQ STD SHAFT STRBL KINK RST NITINOL POLYUR HDRPH URET (WIRE) IMPLANT
GW URO .038IN 150CM 3CM ZIPWR_NG RADOPQ STRBL STD SHFT (WIRE)
GW URO .38IN 150CM 3CM GLDWR STR NITINOL HDRPH (WIRE) ×3 IMPLANT
GW URO .38IN 150CM 3CM GLDWR S_R COR TO TIP NITINOL POLYUR (WIRE) ×1
JELLY LUB EZ BCTRST H2O SOL NG_RS FLPTP TUBE STRL 4OZ LF (MISCELLANEOUS PT CARE ITEMS) ×1
JELLY LUB EZ BCTRST WATER SOL NGRS FLIPTOP TUBE STRL 4OZ LF (MISCELLANEOUS PT CARE ITEMS) ×3 IMPLANT
LABEL STRL CV_C3333UHCV 100ST/BX (LABELS/CHART SUPPLIES) ×4 IMPLANT
PACK CYSTO CUSTOM_CS/9 (CUSTOM TRAYS & PACK) ×1
PACK SURG CSTM CYSTO STRL DISP LF (CUSTOM TRAYS & PACK) ×3
SET 81IN REG CLAMP N-PYRG IRRG 10 GTT/ML STR CSCP DEHP BLADDER STRL LF (IV TUBING & ACCESSORIES) ×3
SET 81IN REG CLAMP N-PYRG IRRG_10 GTT/ML STR CSCP DEHP (IV TUBING & ACCESSORIES) ×1
SET 82IN REG CLAMP N-PYRG IRRG 10 GTT/ML Y TUR DEHP BLADDER STRL LF (IV TUBING & ACCESSORIES) ×3 IMPLANT
SET 82IN REG CLAMP N-PYRG IRRG_10 GTT/ML Y TUR DEHP BLADDER (IV TUBING & ACCESSORIES) ×2
SET DIL 24-30FR TAPER TIP SHEATH AMPLATZ RNL PTFE 17CM LF (Dilators) ×3 IMPLANT
SET DIL 24-30FR TAPER TIP SHTH_AMPLATZ RNL PTFE 17CM LF (Dilators) ×1
SHEATH ACCESS 11-13FR 2 DRMTR DIL RADOPQ INNOVATIVE HUB NVGTR HD URET SS HDRPH 28CM LF  URTSCP (UROLOGICAL SUPPLIES)
SHEATH ACCESS 12-14FR RADOPQ 2 DRMTR DIL HUB NVGTR HD URET SS HDRPH 36CM LF  URTSCP (UROLOGICAL SUPPLIES) IMPLANT
SHEATH NAVIGATOR HD 11/13X28_M0062502210 (UROLOGICAL SUPPLIES)
SHEATH NAVIGATOR HD 12/14X36_M0062502250 (UROLOGICAL SUPPLIES)
SYRINGE LL 10ML LF  STRL GRAD N-PYRG DEHP-FR PVC FREE MED DISP (NEEDLES & SYRINGE SUPPLIES) ×3 IMPLANT
SYRINGE LL 10ML LF STRL MED D_ISP (NEEDLES & SYRINGE SUPPLIES) ×1
SYRINGE LL 20ML LTX STRL MED (NEEDLES & SYRINGE SUPPLIES) ×1
TRAY CYSTO PREP (TRAY) ×4
TUBE CONN 1/4IN X 6FT_45/CS N66A (Connecting Tubes/Misc) ×1
TUBING SUCT CLR 6FT .25IN MEDIVAC NCDTV PLASTIC MAXIGRIP MALE / MALE CONN STRL LF  DISP (Connecting Tubes/Misc) ×3 IMPLANT
UROLOGY BOWL SET UP (INSTRUMENTS) ×2
UROLOGY BOWL SET UP (SURGICAL INSTRUMENTS) ×2 IMPLANT

## 2017-07-28 NOTE — Telephone Encounter (Signed)
Will you please schedule patient for follow up? Thank you!

## 2017-07-28 NOTE — Telephone Encounter (Signed)
-----   Message from Calla Kicks, MD sent at 07/28/2017 12:12 PM EST -----  We cancelled the surgery as he likely passed the stone. Let's schedule him to see me in about 2-3 weeks for f/u.

## 2017-07-28 NOTE — Anesthesia Preprocedure Evaluation (Signed)
ANESTHESIA PRE-OP EVALUATION  Planned Procedure: URETEROSCOPY WITH HOLMIUM LASER LITHOTRIPSY (Right )  CYSTOSCOPY WITH URETERAL STENT INSERTION (Right Ureter)  Review of Systems     anesthesia history negative     patient summary reviewed  nursing notes reviewed        Pulmonary   asthma and recent URI,   Cardiovascular    Hypertension, well controlled and ECG reviewed        GI/Hepatic/Renal   negative GI/hepatic/renal ROS,      Endo/Other    obesity,       Neuro/Psych/MS  negative neuro/psych ROS,      Cancer  negative hematology/oncology ROS,                Physical Assessment      Patient summary reviewed and Nursing notes reviewed   Airway       Mallampati: II    TM distance: >3 FB    Neck ROM: full  Mouth Opening: good.            Dental       Dentition intact             Pulmonary    Breath sounds clear to auscultation       Cardiovascular    Rhythm: regular  Rate: Normal       Other findings            Plan  Planned anesthesia type: GA /LMA    ASA 2     Intravenous induction     Anesthetic plan and risks discussed with patient.             Patient's NPO status is appropriate for Anesthesia.           Plan discussed with CRNA.

## 2017-07-28 NOTE — Progress Notes (Signed)
Patient is scheduled for right URS/LL/stent placement today. Reports that his pain resolved about 24 hours ago, has not seen the stone but has not been straining his urine consistently. KUB does not show any obvious stone, on the CT from OSH stone size was about 4 mm and location was distal. Thus patient most likely passed the stone, surgery today will be cancelled.

## 2017-07-29 LAB — URINE CULTURE: URINE CULTURE: NO GROWTH

## 2017-08-02 ENCOUNTER — Ambulatory Visit (HOSPITAL_BASED_OUTPATIENT_CLINIC_OR_DEPARTMENT_OTHER): Payer: Self-pay | Admitting: Urology

## 2017-08-29 ENCOUNTER — Ambulatory Visit: Payer: BC Managed Care – PPO | Admitting: Urology

## 2017-08-29 ENCOUNTER — Encounter (HOSPITAL_BASED_OUTPATIENT_CLINIC_OR_DEPARTMENT_OTHER): Payer: Self-pay | Admitting: Urology

## 2017-08-29 DIAGNOSIS — R399 Unspecified symptoms and signs involving the genitourinary system: Secondary | ICD-10-CM

## 2017-08-29 DIAGNOSIS — Z87442 Personal history of urinary calculi: Secondary | ICD-10-CM

## 2017-08-29 NOTE — Progress Notes (Signed)
Urology H&P Note     ICD-10-CM    1. History of nephrolithiasis Z87.442      Reason for clinic visit: History of renal stones   History of present illness: Jason Ball is a 56 y.o. male who was previously seen in our clinic for 4 mm right ureteral stone, patient was scheduled for URS but cancelled as he apparently passed the stone (has not been straining his urine).  Had stones before, more than 10 years ago.  Returns today for a f/u. Feels well, no pain. Has some mild LUTS but they are not bothersome.    Past Medical History:   Diagnosis Date    Asthma     well controlled without medications    Blurred vision     HTN (hypertension)     Hyperlipidemia 02/02/2017    Hypertension     Lymphoma (CMS [facility])     Obesity     Palpitations     with anxiety    Ringing in ears     Sinusitis, chronic     Tattoos     x1 left upper arm    Upper respiratory infection     sinusitis 2 weeks ago - cleared up          Past Surgical History:   Procedure Laterality Date    HX ADENOIDECTOMY      HX COLONOSCOPY      HX EYE SURGERY      eyelid lift - 2013    HX LYMPH NODE DISSECTION      HX TONSILLECTOMY            Allergies   Allergen Reactions    No Known Allergies         Current Outpatient Medications:     Amlodipine-Benazepril 10-40 mg Oral Capsule, TAKE 1 CAPSULE BY MOUTH ONCE DAILY, Disp: 30 Cap, Rfl: 5    aspirin (ECOTRIN) 81 mg Oral Tablet, Delayed Release (E.C.), Take 81 mg by mouth Once a day, Disp: , Rfl:     docusate sodium (COLACE) 100 mg Oral Capsule, Take 100 mg by mouth Twice daily, Disp: , Rfl:     ondansetron (ZOFRAN) 4 mg Oral Tablet, Take 4 mg by mouth Every 8 hours as needed for nausea/vomiting, Disp: , Rfl:     oxyCODONE-acetaminophen (PERCOCET) 5-325 mg Oral Tablet, Take 1 Tab by mouth Every 4 hours as needed for Pain (Patient not taking: Reported on 08/29/2017), Disp: 20 Tab, Rfl: 0    tamsulosin (FLOMAX) 0.4 mg Oral Capsule, Take 0.4 mg by mouth Every evening after dinner, Disp: , Rfl:       Family Medical History:     Problem Relation (Age of Onset)    Cancer Father    HTN <20 y.o. Other    Heart Disease Other    No Known Problems Brother, Son, Daughter, Son    Other Mother             Social History     Tobacco Use    Smoking status: Never Smoker    Smokeless tobacco: Never Used   Substance Use Topics    Alcohol use: Yes     Comment: 1 beer every 3 months    Drug use: No      ROS: A comprehensive review of systems was performed. The patient denies fevers, chills, nausea, vomiting, decreased appetite, or unexpected weight loss. The rest of the review of systems was negative except as stated in the above  HPI.   Physical Examination Findings:   There were no vitals taken for this visit.       1. Constitutional/General: No acute distress, alert and oriented.   2. Eyes:?EOMI and sclera anicteric   3. ENT: oropharynx clear; neck supple and no lymphadenopathy   4. Resiratory: good inspiratory effort, clear bilaterally   5. Cardiac: regular rate and rhythm.   6. GI: abdomen soft, nontender, no organomegaly, no masses palpable and?nondistended   7. GU: No flank tenderness.   8. Neuro: alert and oriented x 3, no focal neurological deficits   9. Psychiatric: normal affect and mentation   10. Skin: warm, dry, clear; no rashes or lesions   Labs:   URINE CULTURE   Date Value Ref Range Status   07/26/2017 No significant growth 48 hours.  Final     Imaging: No new relevant imaging.   Assessment and plan:   Jason Ball is a 56 y.o. male with a history of nephrolithiasis, recently passed a stone. Management options discussed. Patient is interested in metabolic evaluation. We will arrange this and see him back with the results.   ?   Submitted by: Calla Kicks MD, PhD - 08/29/2017 - 10:23

## 2017-10-04 ENCOUNTER — Encounter (INDEPENDENT_AMBULATORY_CARE_PROVIDER_SITE_OTHER): Payer: Self-pay | Admitting: Family Medicine

## 2017-10-04 ENCOUNTER — Ambulatory Visit (INDEPENDENT_AMBULATORY_CARE_PROVIDER_SITE_OTHER): Payer: BC Managed Care – PPO | Admitting: Family Medicine

## 2017-10-04 VITALS — BP 150/90 | HR 87 | Temp 98.7°F | Resp 18 | Ht 71.0 in | Wt 274.0 lb

## 2017-10-04 DIAGNOSIS — M25561 Pain in right knee: Secondary | ICD-10-CM

## 2017-10-04 DIAGNOSIS — Z6838 Body mass index (BMI) 38.0-38.9, adult: Secondary | ICD-10-CM

## 2017-10-04 MED ORDER — PREDNISONE 10 MG TABLET
10.0000 mg | ORAL_TABLET | Freq: Every day | ORAL | 0 refills | Status: DC
Start: 2017-10-04 — End: 2017-10-27

## 2017-10-04 NOTE — Progress Notes (Signed)
[place]  [address] [place] [address]  Dept Phone: [phone]  Dept Fax: [phone]    Jason Ball    [id number]    Date of Service: 10/04/2017   Chief complaint:   Chief Complaint   Patient presents with   . Knee Pain     right       Subjective:   C/o increased right knee pain over the past month after putting things in the attic. Aleve helps it. Aching at night. Pain on the inside. He thinks it is getting some better.     Patient Active Problem List    Diagnosis   . Hyperlipidemia   . Lymphoma (CMS [place])   . Asthma   . Hypertension   . Palpitations   . Obesity   . Sinusitis, chronic     Social History     Socioeconomic History   . Marital status: Married     Spouse name: Not on file   . Number of children: 3   . Years of education: 38   . Highest education level: Not on file   Social Needs   . Financial resource strain: Not on file   . Food insecurity - worry: Not on file   . Food insecurity - inability: Not on file   . Transportation needs - medical: Not on file   . Transportation needs - non-medical: Not on file   Occupational History   . Occupation: self employed    Tobacco Use   . Smoking status: Never Smoker   . Smokeless tobacco: Never Used   Substance and Sexual Activity   . Alcohol use: Yes     Comment: 1 beer every 3 months   . Drug use: No   . Sexual activity: Not on file   Other Topics Concern   . Ability to Walk 1 Flight of Steps without SOB/CP Yes   . Routine Exercise No   . Ability to Walk 2 Flight of Steps without SOB/CP Yes   . Unable to Ambulate Not Asked   . Total Care Not Asked   . Ability To Do Own ADL's Yes   . Uses Walker Not Asked   . Other Activity Level Yes     Comment: mechanic activities   . Uses Cane Not Asked   . Abuse/Domestic Violence Not Asked   . Caffeine Concern Not Asked   . Calcium intake adequate Not Asked   . Computer Use Not Asked   . Drives Not Asked   . Exercise Concern Not Asked   . Helmet Use Not Asked   . Seat Belt Not  Asked   . Special Diet Not Asked   . Sunscreen used Not Asked   . Uses Cane No   . Uses walker No   . Uses wheelchair No   . Right hand dominant Yes   . Left hand dominant Not Asked   . Ambidextrous Not Asked   . Shift Work Not Asked   . Unusual Sleep-Wake Schedule Not Asked   Social History Narrative    Denies use of OTC substances (caffeine pills, pseudoephedrine products, dextromethorphan products, and herbal preparations). Denies past/current misuse of prescription medications (opioids, sedatives, stimulants). Denies marijuana use. Denies past/current use of illicit drugs (cocaine, heroin, methamphetamine, hallucinogens, and inhalants).          Family Medical History:     Problem Relation (Age of Onset)    Cancer Father  HTN <20 y.o. Other    Heart Disease Other    No Known Problems Brother, Son, Daughter, Son    Other Mother            Current Outpatient Medications   Medication Sig   . Amlodipine-Benazepril 10-40 mg Oral Capsule TAKE 1 CAPSULE BY MOUTH ONCE DAILY   . aspirin (ECOTRIN) 81 mg Oral Tablet, Delayed Release (E.C.) Take 81 mg by mouth Once a day   . docusate sodium (COLACE) 100 mg Oral Capsule Take 100 mg by mouth Twice daily   . predniSONE (DELTASONE) 10 mg Oral Tablet Take 1 Tab (10 mg total) by mouth Once a day 3 tabs for 3 days, then 2 tabs for 3 days, then 1 tab for 3 days       ROS:  No chest pain, DOE, or palpitations. No orthopnea or PND.  No cough, sputum, or hemoptysis. No fever,chills, or night sweats.  No nausea or vomiting. No abdominal pain. No change in bowel habits  No melena or bright red rectal bleeding.  Voiding without difficulty   No headache , diplopia or loss of function of limbs.    Objective:     BP (!) 150/90   Pulse 87   Temp 37.1 C (98.7 F) (Tympanic)   Resp 18   Ht 1.803 m (5\' 11" )   Wt 124.3 kg (274 lb)   SpO2 95%   BMI 38.22 kg/m       BP Readings from Last 3 Encounters:   10/04/17 (!) 150/90   07/28/17 (!) 151/84   02/06/17 136/78     Wt Readings from  Last 3 Encounters:   10/04/17 124.3 kg (274 lb)   07/28/17 119.3 kg (263 lb)   07/26/17 121.1 kg (267 lb)     General appearance: alert, oriented x 3, in his normal state, cooperative, not in apparent distress, appearing stated age   37:  Eyes:  Pupils equal round react like accommodation extraocular muscles intact. Ears within normal limits throat clear, tonsils normal, no cervical lymphadenopathy thyroid normal  Lungs: clear to auscultation bilaterally, respirations non-labored  Heart: regular rate and rhythm, S1, S2 normal, no murmur, PMI non-diffuse  Abdomen: soft, non-tender. Bowel sounds normal.  Extremities: extremities normal, atraumatic, no cyanosis or edema, pulses intact in upper and lower extremities    Assessment and Plan     Jason Ball was seen today for knee pain.    Diagnoses and all orders for this visit:    Right knee pain  Probable meniscus. Will treat with short course of steroids to help. Call if worsens. Still needs to work on weight.   Other orders  -     predniSONE (DELTASONE) 10 mg Oral Tablet; Take 1 Tab (10 mg total) by mouth Once a day 3 tabs for 3 days, then 2 tabs for 3 days, then 1 tab for 3 days                  Orders Placed This Encounter   . predniSONE (DELTASONE) 10 mg Oral Tablet       S. "Paula Compton, D.O.

## 2017-10-27 ENCOUNTER — Encounter (INDEPENDENT_AMBULATORY_CARE_PROVIDER_SITE_OTHER): Payer: Self-pay | Admitting: Family Medicine

## 2017-10-27 ENCOUNTER — Ambulatory Visit (INDEPENDENT_AMBULATORY_CARE_PROVIDER_SITE_OTHER): Payer: BC Managed Care – PPO | Admitting: Family Medicine

## 2017-10-27 ENCOUNTER — Ambulatory Visit
Admission: RE | Admit: 2017-10-27 | Discharge: 2017-10-27 | Disposition: A | Discharge status: Home / Self Care | Payer: BC Managed Care – PPO | Source: Ambulatory Visit | Attending: Family Medicine | Admitting: Family Medicine

## 2017-10-27 VITALS — BP 120/82 | HR 84 | Temp 98.5°F | Resp 18 | Wt 280.0 lb

## 2017-10-27 DIAGNOSIS — R221 Localized swelling, mass and lump, neck: Secondary | ICD-10-CM

## 2017-10-27 DIAGNOSIS — Z6839 Body mass index (BMI) 39.0-39.9, adult: Secondary | ICD-10-CM

## 2017-10-27 DIAGNOSIS — R590 Localized enlarged lymph nodes: Secondary | ICD-10-CM | POA: Insufficient documentation

## 2017-10-27 NOTE — Progress Notes (Signed)
[place]  [address] [place] [address]  Dept Phone: [phone]  Dept Fax: [phone]    Jason Ball    [id number]    Date of Service: 10/27/2017   Chief complaint:   Chief Complaint   Patient presents with   . Neck Pain       Subjective:   Started lifting weight last week and has developed left shoulder fluid collection. Has had this before. No pain. Has come up on and off over the past 3-4 years.     Patient Active Problem List    Diagnosis   . Hyperlipidemia   . Lymphoma (CMS [place])   . Asthma   . Hypertension   . Palpitations   . Obesity   . Sinusitis, chronic     Social History     Socioeconomic History   . Marital status: Married     Spouse name: Not on file   . Number of children: 3   . Years of education: 26   . Highest education level: Not on file   Social Needs   . Financial resource strain: Not on file   . Food insecurity - worry: Not on file   . Food insecurity - inability: Not on file   . Transportation needs - medical: Not on file   . Transportation needs - non-medical: Not on file   Occupational History   . Occupation: self employed    Tobacco Use   . Smoking status: Never Smoker   . Smokeless tobacco: Never Used   Substance and Sexual Activity   . Alcohol use: Yes     Comment: 1 beer every 3 months   . Drug use: No   . Sexual activity: Not on file   Other Topics Concern   . Ability to Walk 1 Flight of Steps without SOB/CP Yes   . Routine Exercise No   . Ability to Walk 2 Flight of Steps without SOB/CP Yes   . Unable to Ambulate Not Asked   . Total Care Not Asked   . Ability To Do Own ADL's Yes   . Uses Walker Not Asked   . Other Activity Level Yes     Comment: mechanic activities   . Uses Cane Not Asked   . Abuse/Domestic Violence Not Asked   . Caffeine Concern Not Asked   . Calcium intake adequate Not Asked   . Computer Use Not Asked   . Drives Not Asked   . Exercise Concern Not Asked   . Helmet Use Not Asked   . Seat Belt Not Asked   . Special Diet Not  Asked   . Sunscreen used Not Asked   . Uses Cane No   . Uses walker No   . Uses wheelchair No   . Right hand dominant Yes   . Left hand dominant Not Asked   . Ambidextrous Not Asked   . Shift Work Not Asked   . Unusual Sleep-Wake Schedule Not Asked   Social History Narrative    Denies use of OTC substances (caffeine pills, pseudoephedrine products, dextromethorphan products, and herbal preparations). Denies past/current misuse of prescription medications (opioids, sedatives, stimulants). Denies marijuana use. Denies past/current use of illicit drugs (cocaine, heroin, methamphetamine, hallucinogens, and inhalants).          Family Medical History:     Problem Relation (Age of Onset)    Cancer Father    HTN <20 y.o. Other  Heart Disease Other    No Known Problems Brother, Son, Daughter, Son    Other Mother            Current Outpatient Medications   Medication Sig   . Amlodipine-Benazepril 10-40 mg Oral Capsule TAKE 1 CAPSULE BY MOUTH ONCE DAILY   . aspirin (ECOTRIN) 81 mg Oral Tablet, Delayed Release (E.C.) Take 81 mg by mouth Once a day   . docusate sodium (COLACE) 100 mg Oral Capsule Take 100 mg by mouth Twice daily       ROS:  No chest pain, DOE, or palpitations. No orthopnea or PND.  No cough, sputum, or hemoptysis. No fever,chills, or night sweats.  No nausea or vomiting. No abdominal pain. No change in bowel habits  No melena or bright red rectal bleeding.  Voiding without difficulty   No headache , diplopia or loss of function of limbs.    Objective:     BP 120/82   Pulse 84   Temp 36.9 C (98.5 F)   Resp 18   Wt 127 kg (280 lb)   SpO2 97%   BMI 39.05 kg/m       BP Readings from Last 3 Encounters:   10/27/17 120/82   10/04/17 (!) 150/90   07/28/17 (!) 151/84     Wt Readings from Last 3 Encounters:   10/27/17 127 kg (280 lb)   10/04/17 124.3 kg (274 lb)   07/28/17 119.3 kg (263 lb)     General appearance: alert, oriented x 3, in his normal state, cooperative, not in apparent distress, appearing stated  age   57:  Eyes:  Pupils equal round react like accommodation extraocular muscles intact. Ears within normal limits throat clear, tonsils normal, no cervical lymphadenopathy thyroid normal  Lungs: clear to auscultation bilaterally, respirations non-labored  Heart: regular rate and rhythm, S1, S2 normal, no murmur, PMI non-diffuse  Abdomen: soft, non-tender. Bowel sounds normal.  Extremities: extremities normal, atraumatic, no cyanosis or edema, pulses intact in upper and lower extremities    Assessment and Plan     Jason Ball was seen today for neck pain.    Diagnoses and all orders for this visit:    Mass of left side of neck  Feels like lipoma. Will check ultrasound of his neck to make sure is nothing                No orders of the defined types were placed in this encounter.      S. "Paula Compton, D.O.

## 2017-10-30 ENCOUNTER — Telehealth (INDEPENDENT_AMBULATORY_CARE_PROVIDER_SITE_OTHER): Payer: Self-pay | Admitting: Family Medicine

## 2017-10-30 NOTE — Telephone Encounter (Signed)
Discussed with pt and done [place], MA  10/30/2017, 08:45

## 2017-10-30 NOTE — Telephone Encounter (Signed)
-----   Message from Dolores Lory, DO sent at 10/30/2017  8:38 AM EST -----  All testing within reasonable limits. No changes.

## 2018-01-01 ENCOUNTER — Other Ambulatory Visit (INDEPENDENT_AMBULATORY_CARE_PROVIDER_SITE_OTHER): Payer: Self-pay | Admitting: Family Medicine

## 2018-01-01 DIAGNOSIS — Z0000 Encounter for general adult medical examination without abnormal findings: Secondary | ICD-10-CM

## 2018-01-03 ENCOUNTER — Encounter (INDEPENDENT_AMBULATORY_CARE_PROVIDER_SITE_OTHER): Payer: Self-pay | Admitting: Family Medicine

## 2018-01-15 ENCOUNTER — Other Ambulatory Visit (INDEPENDENT_AMBULATORY_CARE_PROVIDER_SITE_OTHER): Payer: Self-pay | Admitting: Family Medicine

## 2018-02-08 ENCOUNTER — Encounter (INDEPENDENT_AMBULATORY_CARE_PROVIDER_SITE_OTHER): Payer: Self-pay | Admitting: Family Medicine

## 2018-02-08 NOTE — Nursing Note (Signed)
Per Dr Rolly Salter, pt is not to have any more refills on meds without bloodwork, and or appt for follow up, pt has only been seen for sick visits, but has not had labs in the past 2 years,  [place], [place]  02/08/2018, 08:26

## 2018-04-10 ENCOUNTER — Encounter (INDEPENDENT_AMBULATORY_CARE_PROVIDER_SITE_OTHER): Payer: Self-pay | Admitting: Family Medicine

## 2018-08-24 ENCOUNTER — Other Ambulatory Visit (INDEPENDENT_AMBULATORY_CARE_PROVIDER_SITE_OTHER): Payer: Self-pay | Admitting: Family Medicine

## 2018-09-27 ENCOUNTER — Ambulatory Visit (INDEPENDENT_AMBULATORY_CARE_PROVIDER_SITE_OTHER): Payer: BC Managed Care – PPO

## 2018-09-27 DIAGNOSIS — Z0000 Encounter for general adult medical examination without abnormal findings: Secondary | ICD-10-CM

## 2018-10-07 ENCOUNTER — Other Ambulatory Visit (INDEPENDENT_AMBULATORY_CARE_PROVIDER_SITE_OTHER): Payer: Self-pay | Admitting: Family Medicine

## 2018-11-22 ENCOUNTER — Other Ambulatory Visit (INDEPENDENT_AMBULATORY_CARE_PROVIDER_SITE_OTHER): Payer: Self-pay | Admitting: Family Medicine

## 2018-11-23 ENCOUNTER — Other Ambulatory Visit (INDEPENDENT_AMBULATORY_CARE_PROVIDER_SITE_OTHER): Payer: Self-pay | Admitting: Family Medicine

## 2018-11-28 ENCOUNTER — Other Ambulatory Visit: Payer: Self-pay

## 2018-11-28 ENCOUNTER — Ambulatory Visit (INDEPENDENT_AMBULATORY_CARE_PROVIDER_SITE_OTHER): Payer: BC Managed Care – PPO | Admitting: Family Medicine

## 2018-11-28 ENCOUNTER — Other Ambulatory Visit (INDEPENDENT_AMBULATORY_CARE_PROVIDER_SITE_OTHER): Payer: Self-pay | Admitting: Family Medicine

## 2018-11-28 ENCOUNTER — Encounter (INDEPENDENT_AMBULATORY_CARE_PROVIDER_SITE_OTHER): Payer: Self-pay | Admitting: Family Medicine

## 2018-11-28 VITALS — BP 146/78 | HR 91 | Temp 97.2°F | Resp 17 | Ht 71.0 in | Wt 287.0 lb

## 2018-11-28 DIAGNOSIS — I10 Essential (primary) hypertension: Secondary | ICD-10-CM

## 2018-11-28 DIAGNOSIS — R739 Hyperglycemia, unspecified: Secondary | ICD-10-CM

## 2018-11-28 DIAGNOSIS — M542 Cervicalgia: Secondary | ICD-10-CM

## 2018-11-28 DIAGNOSIS — Z6841 Body Mass Index (BMI) 40.0 and over, adult: Secondary | ICD-10-CM

## 2018-11-28 DIAGNOSIS — E785 Hyperlipidemia, unspecified: Secondary | ICD-10-CM

## 2018-11-28 NOTE — Progress Notes (Signed)
[place]  [address] [place] [address]  Dept Phone: [phone]  Dept Fax: [phone]    Jason Ball    [id number]    Date of Service: 11/28/2018   Chief complaint:   Chief Complaint   Patient presents with   . Hypertension       Subjective:   Doing okay    Still with left neck and shoulder stiffness and occasional swelling. Started 6 years ago after moving some jet skis and now gets pain intermittently. Doesn't really take anything     BP good when at home.     Tolerating his meds    Due fasting labs.     Patient Active Problem List    Diagnosis   . Hyperlipidemia   . Lymphoma (CMS [place])   . Asthma   . Hypertension   . Palpitations   . Obesity   . Sinusitis, chronic     Social History     Socioeconomic History   . Marital status: Married     Spouse name: Not on file   . Number of children: 3   . Years of education: 59   . Highest education level: Not on file   Occupational History   . Occupation: self employed    Social Needs   . Financial resource strain: Not on file   . Food insecurity     Worry: Not on file     Inability: Not on file   . Transportation needs     Medical: Not on file     Non-medical: Not on file   Tobacco Use   . Smoking status: Never Smoker   . Smokeless tobacco: Never Used   Substance and Sexual Activity   . Alcohol use: Yes     Comment: 1 beer every 3 months   . Drug use: No   . Sexual activity: Not on file   Lifestyle   . Physical activity     Days per week: Not on file     Minutes per session: Not on file   . Stress: Not on file   Relationships   . Social Product manager on phone: Not on file     Gets together: Not on file     Attends religious service: Not on file     Active member of club or organization: Not on file     Attends meetings of clubs or organizations: Not on file     Relationship status: Not on file   . Intimate partner violence     Fear of current or ex partner: Not on file     Emotionally abused: Not on file      Physically abused: Not on file     Forced sexual activity: Not on file   Other Topics Concern   . Ability to Walk 1 Flight of Steps without SOB/CP Yes   . Routine Exercise No   . Ability to Walk 2 Flight of Steps without SOB/CP Yes   . Unable to Ambulate Not Asked   . Total Care Not Asked   . Ability To Do Own ADL's Yes   . Uses Walker Not Asked   . Other Activity Level Yes     Comment: mechanic activities   . Uses Cane Not Asked   . Abuse/Domestic Violence Not Asked   . Caffeine Concern Not Asked   . Calcium intake adequate Not Asked   .  Computer Use Not Asked   . Drives Not Asked   . Exercise Concern Not Asked   . Helmet Use Not Asked   . Seat Belt Not Asked   . Special Diet Not Asked   . Sunscreen used Not Asked   . Uses Cane No   . Uses walker No   . Uses wheelchair No   . Right hand dominant Yes   . Left hand dominant Not Asked   . Ambidextrous Not Asked   . Shift Work Not Asked   . Unusual Sleep-Wake Schedule Not Asked   Social History Narrative    Denies use of OTC substances (caffeine pills, pseudoephedrine products, dextromethorphan products, and herbal preparations). Denies past/current misuse of prescription medications (opioids, sedatives, stimulants). Denies marijuana use. Denies past/current use of illicit drugs (cocaine, heroin, methamphetamine, hallucinogens, and inhalants).          Family Medical History:     Problem Relation (Age of Onset)    Cancer Father    HTN <20 y.o. Other    Heart Disease Other    No Known Problems Brother, Son, Daughter, Son    Other Mother            Current Outpatient Medications   Medication Sig   . Amlodipine-Benazepril 10-40 mg Oral Capsule TAKE 1 CAPSULE BY MOUTH ONCE DAILY   . aspirin (ECOTRIN) 81 mg Oral Tablet, Delayed Release (E.C.) Take 81 mg by mouth Once a day   . docusate sodium (COLACE) 100 mg Oral Capsule Take 100 mg by mouth Twice daily       ROS:  No chest pain, DOE, or palpitations. No orthopnea or PND.  No cough, sputum, or hemoptysis. No fever,chills,  or night sweats.  No nausea or vomiting. No abdominal pain. No change in bowel habits  No melena or bright red rectal bleeding.  Voiding without difficulty   No headache , diplopia or loss of function of limbs.    Objective:     BP (!) 146/78   Pulse 91   Temp 36.2 C (97.2 F) (Tympanic)   Resp 17   Ht 1.803 m (5\' 11" )   Wt 130.2 kg (287 lb)   SpO2 97%   BMI 40.03 kg/m       BP Readings from Last 3 Encounters:   11/28/18 (!) 146/78   10/27/17 120/82   10/04/17 (!) 150/90     Wt Readings from Last 3 Encounters:   11/28/18 130.2 kg (287 lb)   10/27/17 127 kg (280 lb)   10/04/17 124.3 kg (274 lb)     General appearance: alert, oriented x 3, in his normal state, cooperative, not in apparent distress, appearing stated age   58:  Eyes:  Pupils equal round react like accommodation extraocular muscles intact. Ears within normal limits throat clear, tonsils normal, no cervical lymphadenopathy thyroid normal  Lungs: clear to auscultation bilaterally, respirations non-labored  Heart: regular rate and rhythm, S1, S2 normal, no murmur, PMI non-diffuse  Abdomen: soft, non-tender. Bowel sounds normal.  Extremities: extremities normal, atraumatic, no cyanosis or edema, pulses intact in upper and lower extremities    Assessment and Plan     Prabhav was seen today for hypertension.    Diagnoses and all orders for this visit:    Neck pain  Will check films of neck and shoulder. Proabbly recurrent strains vs. Deg disk disease.   -     Cervical Spine series; Future  -  Left Shoulder Xray; Future    Essential hypertension  Good when he takes his meds. Needs to be more compliant with appts.   Hyperlipidemia, unspecified hyperlipidemia type  Check fasting labs.   -     COMPREHENSIVE METABOLIC PNL, FASTING; Future  -     LIPID PANEL; Future    Hyperglycemia  Check A1C.   -     HGA1C (HEMOGLOBIN A1C WITH EST AVG GLUCOSE); Future                  Orders Placed This Encounter   . Cervical Spine series   . Left Shoulder Xray   .  HGA1C (HEMOGLOBIN A1C WITH EST AVG GLUCOSE)   . COMPREHENSIVE METABOLIC PNL, FASTING   . LIPID PANEL       S. "Paula Compton, D.O.

## 2018-11-28 NOTE — Nursing Note (Signed)
11/28/18 1000   Depression Screen   Little interest or pleasure in doing things. 0   Feeling down, depressed, or hopeless 0   PHQ 2 Total 0

## 2018-11-29 MED ORDER — AMLODIPINE 10 MG-BENAZEPRIL 40 MG CAPSULE: Cap | ORAL | 5 refills | 0 days | Status: AC

## 2019-01-16 ENCOUNTER — Telehealth (INDEPENDENT_AMBULATORY_CARE_PROVIDER_SITE_OTHER): Payer: Self-pay | Admitting: Family Medicine

## 2019-01-16 NOTE — Telephone Encounter (Signed)
He needs an appt tomorrow or needs to go to urgent care

## 2019-01-16 NOTE — Telephone Encounter (Signed)
Spoke with pt, made him an appt for 01/17/19 with Rolly Salter

## 2019-01-16 NOTE — Telephone Encounter (Signed)
Pt called wanting to know what to do, doesn't know if you want to see him or if you want to send him somewhere. States he is having rt side pain up from the kidney area, that is not going away. ? Do you want to see him  [phone]

## 2019-01-17 ENCOUNTER — Other Ambulatory Visit: Payer: BC Managed Care – PPO | Attending: Family Medicine | Admitting: Family Medicine

## 2019-01-17 ENCOUNTER — Ambulatory Visit (INDEPENDENT_AMBULATORY_CARE_PROVIDER_SITE_OTHER): Payer: BC Managed Care – PPO | Admitting: Family Medicine

## 2019-01-17 ENCOUNTER — Other Ambulatory Visit: Payer: Self-pay

## 2019-01-17 ENCOUNTER — Encounter (INDEPENDENT_AMBULATORY_CARE_PROVIDER_SITE_OTHER): Payer: Self-pay | Admitting: Family Medicine

## 2019-01-17 VITALS — BP 142/82 | HR 100 | Temp 98.2°F | Resp 17 | Ht 71.0 in | Wt 287.0 lb

## 2019-01-17 DIAGNOSIS — Z0000 Encounter for general adult medical examination without abnormal findings: Secondary | ICD-10-CM

## 2019-01-17 DIAGNOSIS — E785 Hyperlipidemia, unspecified: Secondary | ICD-10-CM

## 2019-01-17 DIAGNOSIS — I10 Essential (primary) hypertension: Secondary | ICD-10-CM

## 2019-01-17 DIAGNOSIS — R739 Hyperglycemia, unspecified: Secondary | ICD-10-CM

## 2019-01-17 DIAGNOSIS — Z6841 Body Mass Index (BMI) 40.0 and over, adult: Secondary | ICD-10-CM

## 2019-01-17 DIAGNOSIS — R109 Unspecified abdominal pain: Secondary | ICD-10-CM

## 2019-01-17 LAB — COMPREHENSIVE METABOLIC PNL, FASTING
ALBUMIN: 4.4 g/dL (ref 3.2–4.6)
ALKALINE PHOSPHATASE: 61 U/L (ref 20–130)
ALT (SGPT): 29 U/L (ref <=52)
ANION GAP: 9 mmol/L
AST (SGOT): 20 U/L (ref <=35)
BILIRUBIN TOTAL: 0.7 mg/dL (ref 0.3–1.2)
BUN/CREA RATIO: 16
BUN: 16 mg/dL (ref 10–25)
CALCIUM: 9.5 mg/dL (ref 8.8–10.3)
CHLORIDE: 104 mmol/L (ref 98–111)
CO2 TOTAL: 26 mmol/L (ref 21–35)
CREATININE: 0.99 mg/dL (ref <=1.30)
ESTIMATED GFR: >60 mL/min/1.73mˆ2
GLUCOSE: 165 mg/dL — ABNORMAL HIGH (ref 70–110)
PROTEIN TOTAL: 7 g/dL (ref 6.0–8.3)
SODIUM: 139 mmol/L (ref 135–145)

## 2019-01-17 LAB — POCT URINE DIPSTICK
BILIRUBIN: NEGATIVE
BLOOD: NEGATIVE
GLUCOSE: NEGATIVE
KETONE: NEGATIVE
LEUKOCYTES: NEGATIVE
NITRITE: NEGATIVE
PH: 6
SPECIFIC GRAVITY: 1.015
UROBILINOGEN: 0.02

## 2019-01-17 LAB — HEPATITIS C ANTIBODY: HCV ANTIBODY QUALITATIVE: NEGATIVE

## 2019-01-17 LAB — HGA1C (HEMOGLOBIN A1C WITH EST AVG GLUCOSE)
ESTIMATED AVERAGE GLUCOSE: 131 mg/dL
HEMOGLOBIN A1C: 6.2 % — ABNORMAL HIGH (ref 4.1–5.7)

## 2019-01-17 LAB — LIPID PANEL
CHOLESTEROL: 233 mg/dL — ABNORMAL HIGH (ref 0–199)
LDL DIRECT: 160 mg/dL — ABNORMAL HIGH (ref 0–99)
TRIGLYCERIDES: 183 mg/dL (ref 0–199)

## 2019-01-17 MED ORDER — NAPROXEN 500 MG TABLET
500.00 mg | ORAL_TABLET | Freq: Two times a day (BID) | ORAL | 1 refills | Status: DC | PRN
Start: 2019-01-17 — End: 2019-10-30

## 2019-01-17 NOTE — Progress Notes (Signed)
[facility]  [address] [place] [address]  Dept Phone: [phone]  Dept Fax: [phone]    Jason Ball    [id number]    Date of Service: 01/17/2019   Chief complaint:   Chief Complaint   Patient presents with   . Flank Pain       Subjective:   C/o some upper back pain over the past couple months. Will get better on it's own and then will come back with lifiting. Rest has helped him in the past. Aleve did help also. No urinary symptoms, but family was concerned.     Doing well otherwise.     Tolerating his medications    Labs today.     Patient Active Problem List    Diagnosis   . Hyperlipidemia   . Lymphoma ([facility] [place])   . Asthma   . Hypertension   . Palpitations   . Obesity   . Sinusitis, chronic     Social History     Socioeconomic History   . Marital status: Married     Spouse name: Not on file   . Number of children: 3   . Years of education: 71   . Highest education level: Not on file   Occupational History   . Occupation: self employed    Social Needs   . Financial resource strain: Not on file   . Food insecurity     Worry: Not on file     Inability: Not on file   . Transportation needs     Medical: Not on file     Non-medical: Not on file   Tobacco Use   . Smoking status: Never Smoker   . Smokeless tobacco: Never Used   Substance and Sexual Activity   . Alcohol use: Yes     Comment: 1 beer every 3 months   . Drug use: No   . Sexual activity: Not on file   Lifestyle   . Physical activity     Days per week: Not on file     Minutes per session: Not on file   . Stress: Not on file   Relationships   . Social Product manager on phone: Not on file     Gets together: Not on file     Attends religious service: Not on file     Active member of club or organization: Not on file     Attends meetings of clubs or organizations: Not on file     Relationship status: Not on file   . Intimate partner violence     Fear of current or ex partner: Not on file     Emotionally  abused: Not on file     Physically abused: Not on file     Forced sexual activity: Not on file   Other Topics Concern   . Ability to Walk 1 Flight of Steps without SOB/CP Yes   . Routine Exercise No   . Ability to Walk 2 Flight of Steps without SOB/CP Yes   . Unable to Ambulate Not Asked   . Total Care Not Asked   . Ability To Do Own ADL's Yes   . Uses Walker Not Asked   . Other Activity Level Yes     Comment: mechanic activities   . Uses Cane Not Asked   . Abuse/Domestic Violence Not Asked   . Caffeine Concern Not Asked   . Calcium  intake adequate Not Asked   . Computer Use Not Asked   . Drives Not Asked   . Exercise Concern Not Asked   . Helmet Use Not Asked   . Seat Belt Not Asked   . Special Diet Not Asked   . Sunscreen used Not Asked   . Uses Cane No   . Uses walker No   . Uses wheelchair No   . Right hand dominant Yes   . Left hand dominant Not Asked   . Ambidextrous Not Asked   . Shift Work Not Asked   . Unusual Sleep-Wake Schedule Not Asked   Social History Narrative    Denies use of OTC substances (caffeine pills, pseudoephedrine products, dextromethorphan products, and herbal preparations). Denies past/current misuse of prescription medications (opioids, sedatives, stimulants). Denies marijuana use. Denies past/current use of illicit drugs (cocaine, heroin, methamphetamine, hallucinogens, and inhalants).          Family Medical History:     Problem Relation (Age of Onset)    Cancer Father    HTN <20 y.o. Other    Heart Disease Other    No Known Problems Brother, Son, Daughter, Son    Other Mother            Current Outpatient Medications   Medication Sig   . Amlodipine-Benazepril 10-40 mg Oral Capsule TAKE 1 CAPSULE BY MOUTH ONCE DAILY   . aspirin (ECOTRIN) 81 mg Oral Tablet, Delayed Release (E.C.) Take 81 mg by mouth Once a day   . docusate sodium (COLACE) 100 mg Oral Capsule Take 100 mg by mouth Twice daily   . naproxen (NAPROSYN) 500 mg Oral Tablet Take 1 Tab (500 mg total) by mouth Twice per day as  needed for Pain       ROS:  No chest pain, DOE, or palpitations. No orthopnea or PND.  No cough, sputum, or hemoptysis. No fever,chills, or night sweats.  No nausea or vomiting. No abdominal pain. No change in bowel habits  No melena or bright red rectal bleeding.  Voiding without difficulty   No headache , diplopia or loss of function of limbs.    Objective:     BP (!) 142/82   Pulse 100   Temp 36.8 C (98.2 F) (Tympanic)   Resp 17   Ht 1.803 m (5\' 11" )   Wt 130 kg (287 lb)   SpO2 96%   BMI 40.03 kg/m       BP Readings from Last 3 Encounters:   01/17/19 (!) 142/82   11/28/18 (!) 146/78   10/27/17 120/82     Wt Readings from Last 3 Encounters:   01/17/19 130 kg (287 lb)   11/28/18 130 kg (287 lb)   10/27/17 127 kg (280 lb)     General appearance: alert, oriented x 3, in his normal state, cooperative, not in apparent distress, appearing stated age   58:  Eyes:  Pupils equal round react like accommodation extraocular muscles intact. Ears within normal limits throat clear, tonsils normal, no cervical lymphadenopathy thyroid normal  Lungs: clear to auscultation bilaterally, respirations non-labored  Heart: regular rate and rhythm, S1, S2 normal, no murmur, PMI non-diffuse  Abdomen: soft, non-tender. Bowel sounds normal.  Extremities: extremities normal, atraumatic, no cyanosis or edema, pulses intact in upper and lower extremities  Back: right mid flank tedner to palpation. Good ROM.   Assessment and Plan     Jason Ball was seen today for flank pain.    Diagnoses and all  orders for this visit:    Right flank pain   UA normal. Will get on some naproxen prn to help. Rest.   -     POCT URINE DIPSTICK    Essential hypertension  Doing okay on current med. Check labs today  Hyperlipidemia, unspecified hyperlipidemia type  Check fasting labs today.   -     COMPREHENSIVE METABOLIC PNL, FASTING; Future  -     LIPID PANEL; Future    Hyperglycemia   Check A1C today.   -     HGA1C (HEMOGLOBIN A1C WITH EST AVG GLUCOSE);  Future    Other orders  -     naproxen (NAPROSYN) 500 mg Oral Tablet; Take 1 Tab (500 mg total) by mouth Twice per day as needed for Pain                  Orders Placed This Encounter   . HGA1C (HEMOGLOBIN A1C WITH EST AVG GLUCOSE)   . COMPREHENSIVE METABOLIC PNL, FASTING   . LIPID PANEL   . POCT URINE DIPSTICK   . naproxen (NAPROSYN) 500 mg Oral Tablet       S. "Paula Compton, D.O.

## 2019-01-18 ENCOUNTER — Other Ambulatory Visit (INDEPENDENT_AMBULATORY_CARE_PROVIDER_SITE_OTHER): Payer: Self-pay | Admitting: Family Medicine

## 2019-01-18 MED ORDER — ATORVASTATIN 10 MG TABLET
10.0000 mg | ORAL_TABLET | Freq: Every day | ORAL | 1 refills | Status: DC
Start: 2019-01-18 — End: 2019-05-16

## 2019-01-18 NOTE — Telephone Encounter (Signed)
-----   Message from Milinda Antis, DO sent at 01/18/2019  8:32 AM EDT -----  Cholesterol bad. Need to start cholesterol med at this point. Sugars continue to be up, but not quite diabetic yet. Recheck labs in 3 months prior to his appt.

## 2019-01-18 NOTE — Telephone Encounter (Signed)
Patient notified of lab work results and will start Atorvastatin 10 mg daily per Dr. Dalphine Handing instructions. Forestine Na, RN  01/18/2019, 10:55

## 2019-03-30 LAB — HISTORICAL SURGICAL PATHOLOGY SPECIMEN

## 2019-04-25 IMAGING — CT CT RENAL STONE PROTOCOL
2 of 3 series · 16 of 46 positions shown, 18 images · non-contrast
Comparison: None.

CLINICAL DATA: 56-year-old male with right flank pain.

EXAM:
CT ABDOMEN AND PELVIS WITHOUT CONTRAST
TECHNIQUE: Multidetector CT imaging of the abdomen and pelvis was performed
following the standard protocol without IV contrast.

[Series 3: lung · axial · 0.79mm/px · z∈[-174,-72]mm · 13 of 59 slices shown, 15 images]
[im 4/59  soft-tissue]
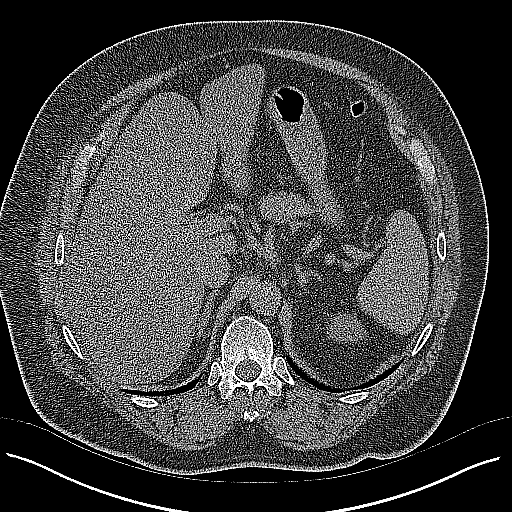
[im 4/59  bone]
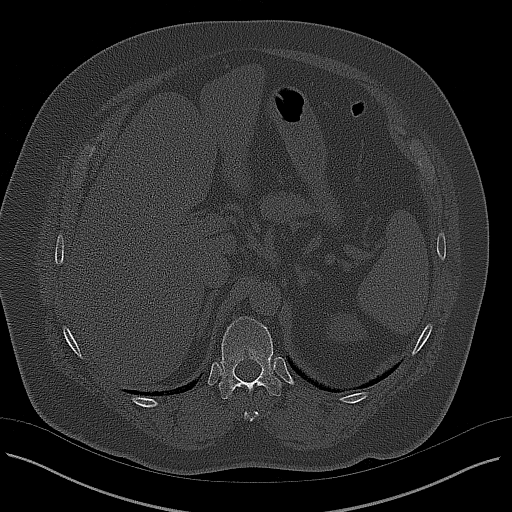
[im 8/59  soft-tissue]
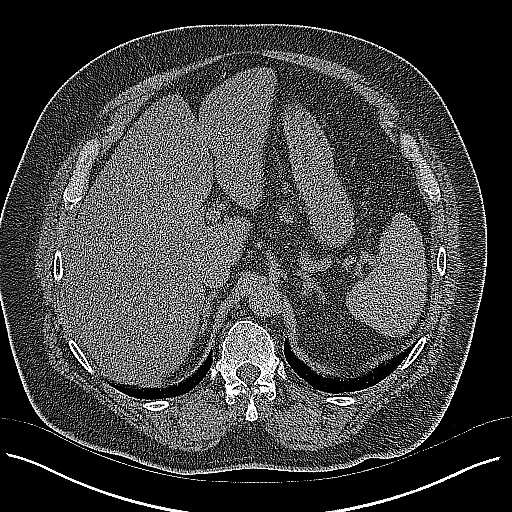
[im 12/59  soft-tissue]
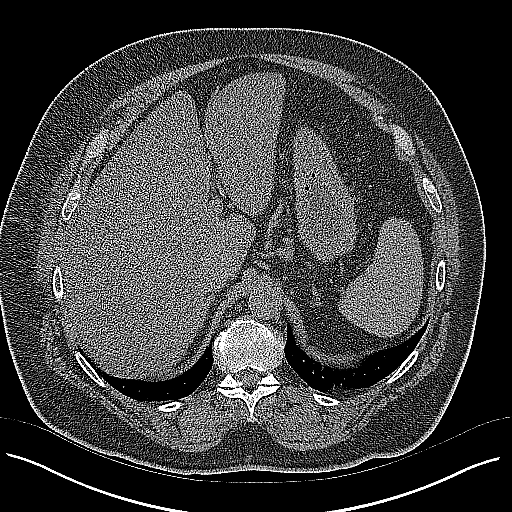
[im 17/59  soft-tissue]
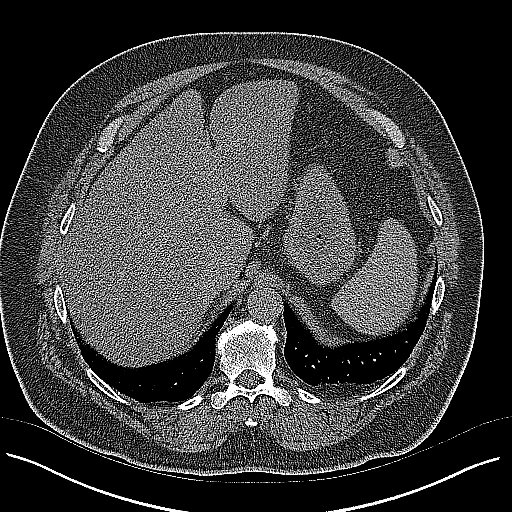
[im 21/59  soft-tissue]
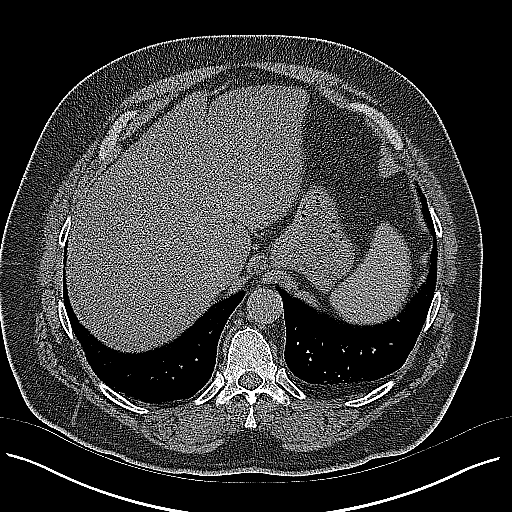
[im 25/59  soft-tissue]
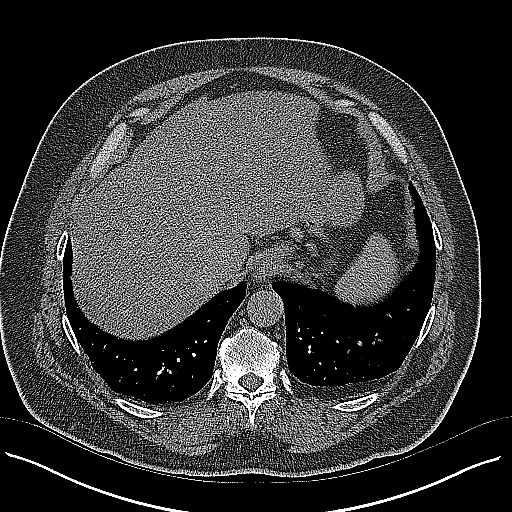
[im 30/59  soft-tissue]
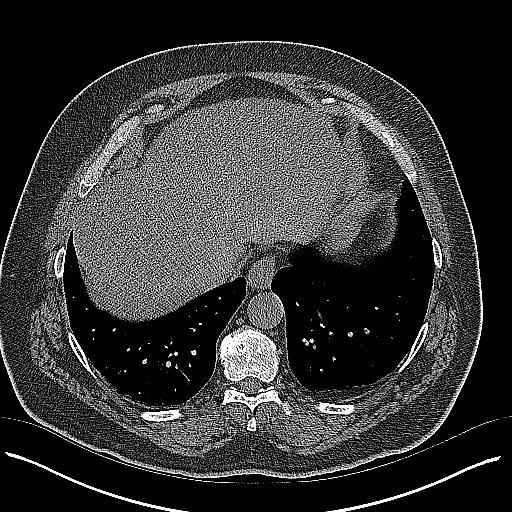
[im 34/59  soft-tissue]
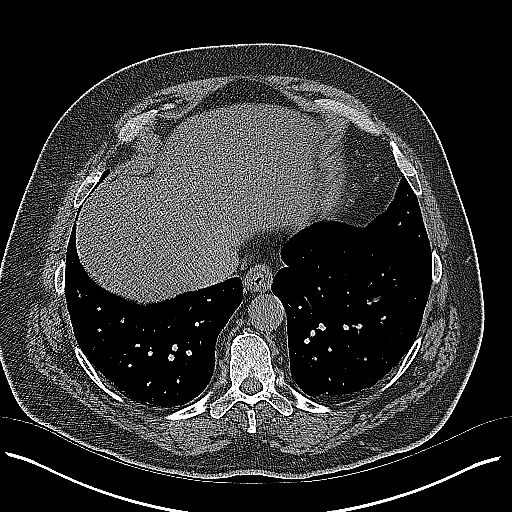
[im 38/59  soft-tissue]
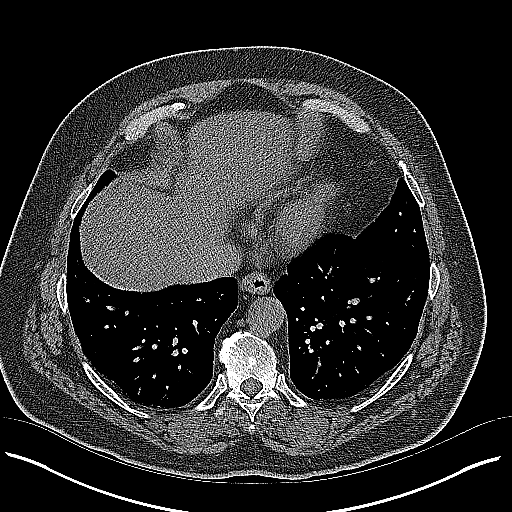
[im 38/59  bone]
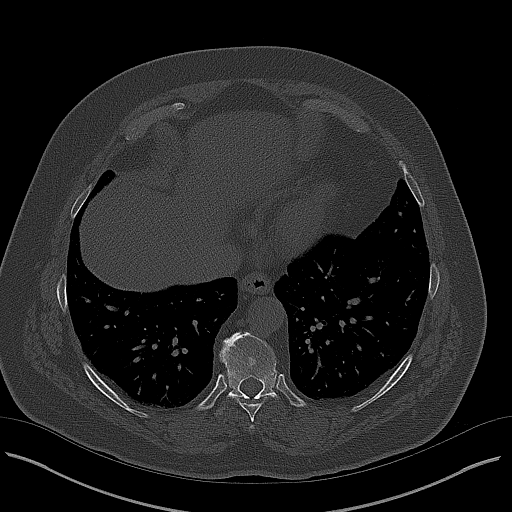
[im 42/59  soft-tissue]
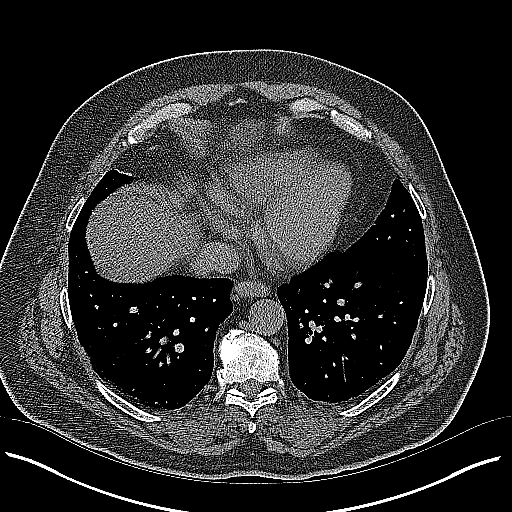
[im 47/59  soft-tissue]
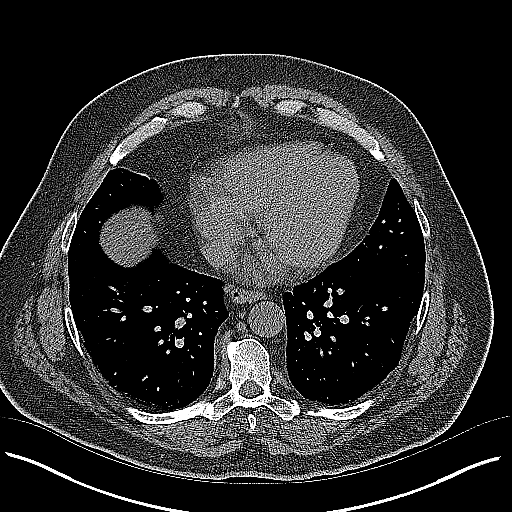
[im 51/59  soft-tissue]
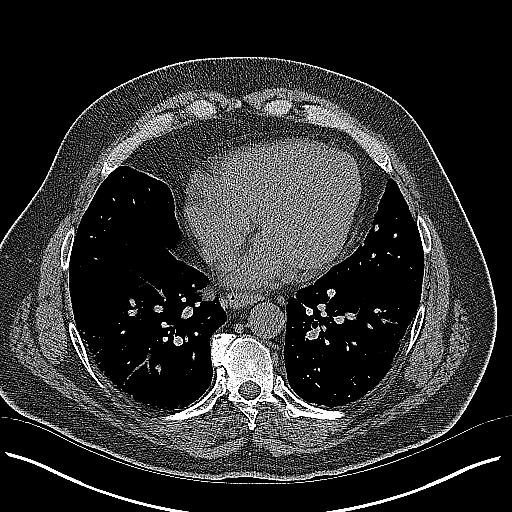
[im 55/59  soft-tissue]
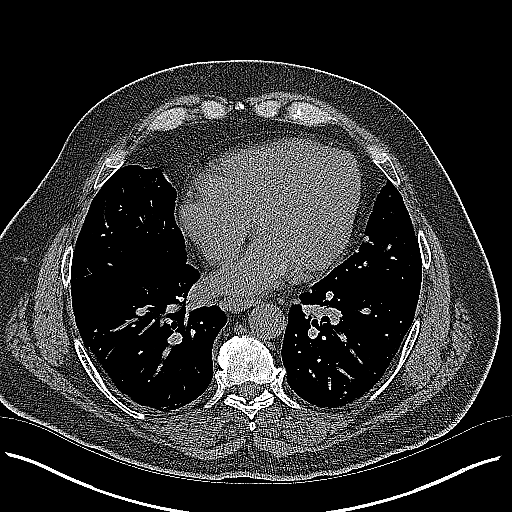

[Series 4: coronal · coronal · 0.79mm/px · 3 of 173 slices shown]
[im 58/173  soft-tissue]
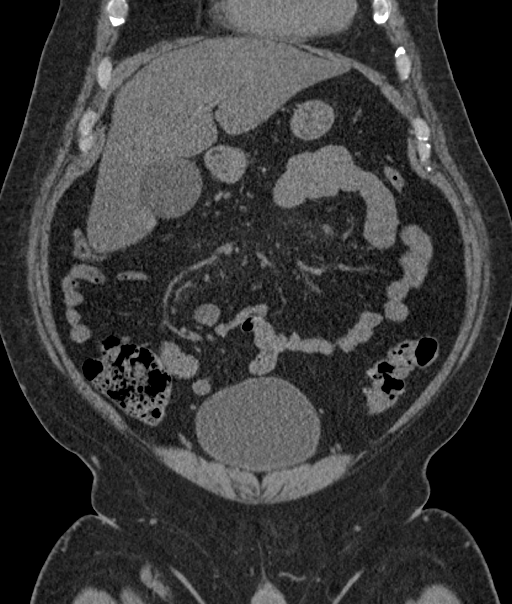
[im 77/173  soft-tissue]
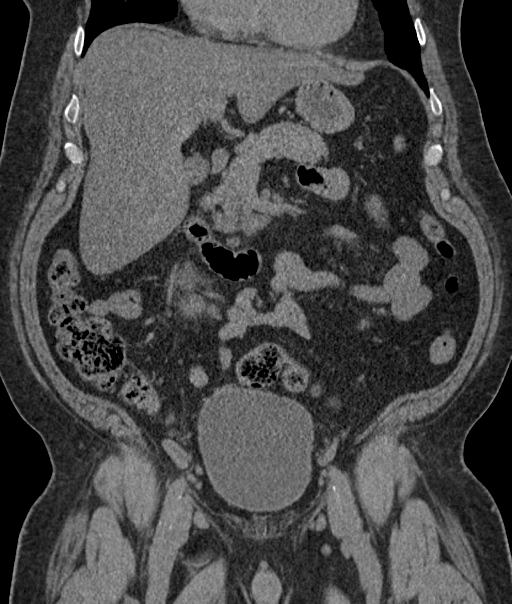
[im 96/173  soft-tissue]
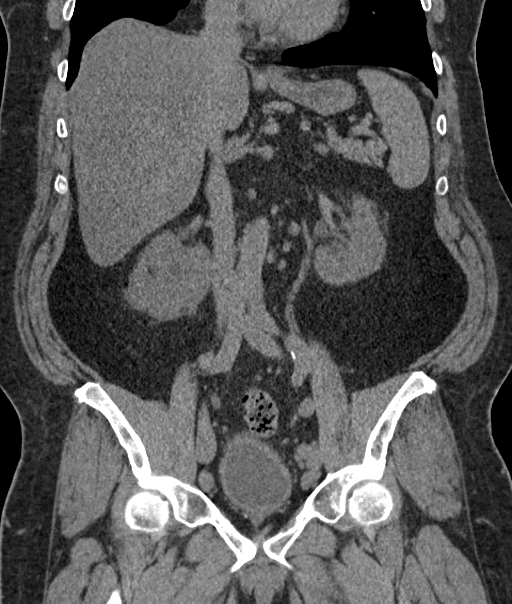

[16 of 46 positions shown; findings below may reference images not displayed]

FINDINGS: Evaluation of this exam is limited in the absence of intravenous
contrast.

Lower chest: The visualized lung bases are clear.

No intra-abdominal free air or free fluid.

Hepatobiliary: There is diffuse fatty infiltration of the liver. No
intrahepatic biliary ductal dilatation. The gallbladder is
physiologically distended and appears unremarkable.

Pancreas: Unremarkable. No pancreatic ductal dilatation or
surrounding inflammatory changes.

Spleen: Normal in size without focal abnormality.

Adrenals/Urinary Tract: The adrenal glands are unremarkable. There
is non rotation of the right kidney. There is a 4 mm distal right
ureteral calculus with mild right hydronephrosis. There are 2
nonobstructing stone in the inferior pole of the left kidney
measuring up to 4 mm. There is no hydronephrosis on the left. Mild
thickened and trabecular appearance of the bladder wall may be
related to chronic bladder outlet obstruction. Correlation with
urinalysis recommended to exclude UTI.

Stomach/Bowel: There small scattered sigmoid diverticula without
active inflammatory changes. There is no evidence of bowel
obstruction or active inflammation. The appendix is normal.

Vascular/Lymphatic: Minimal aortoiliac atherosclerotic disease. The
abdominal aorta and IVC are otherwise unremarkable on this
noncontrast CT. No portal venous gas identified. Mildly enlarged
bilateral iliac chain lymph nodes measuring up to 18 mm in short
axis on the right, of indeterminate clinical significance, possibly
reactive. Clinical correlation is recommended.

Reproductive: The prostate and seminal vesicles are grossly
unremarkable.

Other: None

Musculoskeletal: Mild degenerative changes of the spine. L5-S1 disc
desiccation with vacuum phenomena. No acute osseous pathology.
IMPRESSION: 1. A 4 mm distal right ureteral calculus with mild right
hydronephrosis. Small nonobstructing left renal calculi. No
hydronephrosis on the left.
2. Fatty liver.
3. Small colonic diverticulosis. No bowel obstruction or active
inflammation.

## 2019-04-26 ENCOUNTER — Encounter (INDEPENDENT_AMBULATORY_CARE_PROVIDER_SITE_OTHER): Payer: Self-pay | Admitting: Family Medicine

## 2019-05-15 ENCOUNTER — Other Ambulatory Visit: Payer: Self-pay

## 2019-05-15 ENCOUNTER — Ambulatory Visit (INDEPENDENT_AMBULATORY_CARE_PROVIDER_SITE_OTHER): Payer: BC Managed Care – PPO

## 2019-05-15 ENCOUNTER — Other Ambulatory Visit: Payer: BC Managed Care – PPO | Attending: Family Medicine | Admitting: Family Medicine

## 2019-05-15 ENCOUNTER — Ambulatory Visit (INDEPENDENT_AMBULATORY_CARE_PROVIDER_SITE_OTHER): Payer: BC Managed Care – PPO | Admitting: Family Medicine

## 2019-05-15 ENCOUNTER — Encounter (INDEPENDENT_AMBULATORY_CARE_PROVIDER_SITE_OTHER): Payer: Self-pay | Admitting: Family Medicine

## 2019-05-15 VITALS — BP 132/84 | HR 88 | Temp 98.3°F | Resp 17 | Ht 71.0 in | Wt 283.0 lb

## 2019-05-15 DIAGNOSIS — I10 Essential (primary) hypertension: Secondary | ICD-10-CM

## 2019-05-15 DIAGNOSIS — R739 Hyperglycemia, unspecified: Secondary | ICD-10-CM

## 2019-05-15 DIAGNOSIS — Z6839 Body mass index (BMI) 39.0-39.9, adult: Secondary | ICD-10-CM

## 2019-05-15 DIAGNOSIS — E785 Hyperlipidemia, unspecified: Secondary | ICD-10-CM

## 2019-05-15 DIAGNOSIS — H6120 Impacted cerumen, unspecified ear: Secondary | ICD-10-CM

## 2019-05-15 LAB — COMPREHENSIVE METABOLIC PNL, FASTING
ALBUMIN: 4.3 g/dL (ref 3.2–4.6)
ALKALINE PHOSPHATASE: 71 U/L (ref 20–130)
ALT (SGPT): 24 U/L (ref <=52)
ANION GAP: 10 mmol/L
AST (SGOT): 20 U/L (ref <=35)
BILIRUBIN TOTAL: 0.8 mg/dL (ref 0.3–1.2)
BUN/CREA RATIO: 16
BUN: 14 mg/dL (ref 10–25)
CALCIUM: 9.6 mg/dL (ref 8.8–10.3)
CHLORIDE: 103 mmol/L (ref 98–111)
CO2 TOTAL: 27 mmol/L (ref 21–35)
CREATININE: 0.87 mg/dL (ref <=1.30)
ESTIMATED GFR: >60 mL/min/{1.73_m2}
GLUCOSE: 108 mg/dL (ref 70–110)
POTASSIUM: 4.5 mmol/L (ref 3.5–5.0)
PROTEIN TOTAL: 6.9 g/dL (ref 6.0–8.3)
SODIUM: 140 mmol/L (ref 135–145)

## 2019-05-15 LAB — LIPID PANEL
CHOLESTEROL: 172 mg/dL (ref 0–199)
HDL CHOL: 51 mg/dL (ref >40)
LDL DIRECT: 119 mg/dL — ABNORMAL HIGH (ref 0–99)
TRIGLYCERIDES: 99 mg/dL (ref 0–199)
VLDL CALC: 20 mg/dL (ref 0–50)

## 2019-05-15 NOTE — Progress Notes (Signed)
Jason Ball  [address] [place] [address]  Dept Phone: [phone]  Dept Fax: [phone]    Kable Reth    [id number]    Date of Service: 05/15/2019   Chief complaint:   Chief Complaint   Patient presents with   . Hypertension       Subjective:   C/o right ear with crackling for a few weeks. No other issues with his hearing. No pain. Not taken anything for it    Doing well otherwise.    Tolerating his medicaitons    Labs today.     Patient Active Problem List    Diagnosis   . Hyperlipidemia   . Lymphoma ([facility])   . Asthma   . Hypertension   . Palpitations   . Obesity   . Sinusitis, chronic     Social History     Socioeconomic History   . Marital status: Married     Spouse name: Not on file   . Number of children: 3   . Years of education: 74   . Highest education level: Not on file   Occupational History   . Occupation: self employed    Social Needs   . Financial resource strain: Not on file   . Food insecurity     Worry: Not on file     Inability: Not on file   . Transportation needs     Medical: Not on file     Non-medical: Not on file   Tobacco Use   . Smoking status: Never Smoker   . Smokeless tobacco: Never Used   Substance and Sexual Activity   . Alcohol use: Yes     Comment: 1 beer every 3 months   . Drug use: No   . Sexual activity: Not on file   Lifestyle   . Physical activity     Days per week: Not on file     Minutes per session: Not on file   . Stress: Not on file   Relationships   . Social Product manager on phone: Not on file     Gets together: Not on file     Attends religious service: Not on file     Active member of club or organization: Not on file     Attends meetings of clubs or organizations: Not on file     Relationship status: Not on file   . Intimate partner violence     Fear of current or ex partner: Not on file     Emotionally abused: Not on file     Physically abused: Not on file     Forced sexual activity: Not on file   Other Topics  Concern   . Ability to Walk 1 Flight of Steps without SOB/CP Yes   . Routine Exercise No   . Ability to Walk 2 Flight of Steps without SOB/CP Yes   . Unable to Ambulate Not Asked   . Total Care Not Asked   . Ability To Do Own ADL's Yes   . Uses Walker Not Asked   . Other Activity Level Yes     Comment: mechanic activities   . Uses Cane Not Asked   . Abuse/Domestic Violence Not Asked   . Caffeine Concern Not Asked   . Calcium intake adequate Not Asked   . Computer Use Not Asked   . Drives Not Asked   . Exercise Concern  Not Asked   . Helmet Use Not Asked   . Seat Belt Not Asked   . Special Diet Not Asked   . Sunscreen used Not Asked   . Uses Cane No   . Uses walker No   . Uses wheelchair No   . Right hand dominant Yes   . Left hand dominant Not Asked   . Ambidextrous Not Asked   . Shift Work Not Asked   . Unusual Sleep-Wake Schedule Not Asked   Social History Narrative    Denies use of OTC substances (caffeine pills, pseudoephedrine products, dextromethorphan products, and herbal preparations). Denies past/current misuse of prescription medications (opioids, sedatives, stimulants). Denies marijuana use. Denies past/current use of illicit drugs (cocaine, heroin, methamphetamine, hallucinogens, and inhalants).          Family Medical History:     Problem Relation (Age of Onset)    Cancer Father    HTN <20 y.o. Other    Heart Disease Other    No Known Problems Brother, Son, Daughter, Son    Other Mother            Current Outpatient Medications   Medication Sig   . Amlodipine-Benazepril 10-40 mg Oral Capsule TAKE 1 CAPSULE BY MOUTH ONCE DAILY   . aspirin (ECOTRIN) 81 mg Oral Tablet, Delayed Release (E.C.) Take 81 mg by mouth Once a day   . atorvastatin (LIPITOR) 10 mg Oral Tablet Take 1 Tab (10 mg total) by mouth Once a day   . docusate sodium (COLACE) 100 mg Oral Capsule Take 100 mg by mouth Twice daily   . naproxen (NAPROSYN) 500 mg Oral Tablet Take 1 Tab (500 mg total) by mouth Twice per day as needed for Pain              ROS:  No chest pain, DOE, or palpitations. No orthopnea or PND.  No cough, sputum, or hemoptysis. No fever,chills, or night sweats.  No nausea or vomiting. No abdominal pain. No change in bowel habits  No melena or bright red rectal bleeding.  Voiding without difficulty   No headache , diplopia or loss of function of limbs.    Objective:     BP 132/84   Pulse 88   Temp 36.8 C (98.3 F) (Tympanic)   Resp 17   Ht 1.803 m (5\' 11" )   Wt 128 kg (283 lb)   SpO2 96%   BMI 39.47 kg/m       BP Readings from Last 3 Encounters:   05/15/19 132/84   01/17/19 (!) 142/82   11/28/18 (!) 146/78     Wt Readings from Last 3 Encounters:   05/15/19 128 kg (283 lb)   01/17/19 130 kg (287 lb)   11/28/18 130 kg (287 lb)     General appearance: alert, oriented x 3, in his normal state, cooperative, not in apparent distress, appearing stated age   58:  Eyes:  Pupils equal round react like accommodation extraocular muscles intact. Ears within normal limits throat clear, tonsils normal, no cervical lymphadenopathy thyroid normal  Lungs: clear to auscultation bilaterally, respirations non-labored  Heart: regular rate and rhythm, S1, S2 normal, no murmur, PMI non-diffuse  Abdomen: soft, non-tender. Bowel sounds normal.  Extremities: extremities normal, atraumatic, no cyanosis or edema, pulses intact in upper and lower extremities    Assessment and Plan     Danyl was seen today for hypertension.    Diagnoses and all orders for this visit:  Hyperglycemia  Really need to get his weight down. Check A1C today.   -     HGA1C (HEMOGLOBIN A1C WITH EST AVG GLUCOSE); Future    Essential hypertension  Good. Same meds.   Hyperlipidemia, unspecified hyperlipidemia type  Doing okay with atorvastatin. Check lipids today  -     COMPREHENSIVE METABOLIC PNL, FASTING; Future  -     LIPID PANEL; Future    Impacted cerumen, unspecified laterality  -     POCT REMOVAL IMPACTED CERUMEN-NURSING                  Orders Placed This Encounter   . HGA1C  (HEMOGLOBIN A1C WITH EST AVG GLUCOSE)   . COMPREHENSIVE METABOLIC PNL, FASTING   . LIPID PANEL   . POCT REMOVAL IMPACTED CERUMEN-NURSING       S. "Paula Compton, D.O.

## 2019-05-16 ENCOUNTER — Other Ambulatory Visit (INDEPENDENT_AMBULATORY_CARE_PROVIDER_SITE_OTHER): Payer: Self-pay | Admitting: Family Medicine

## 2019-05-16 LAB — HGA1C (HEMOGLOBIN A1C WITH EST AVG GLUCOSE)
ESTIMATED AVERAGE GLUCOSE: 140 mg/dL
HEMOGLOBIN A1C: 6.5 % — ABNORMAL HIGH (ref 4.1–5.7)

## 2019-05-16 MED ORDER — ATORVASTATIN 20 MG TABLET
20.0000 mg | ORAL_TABLET | Freq: Every day | ORAL | 3 refills | Status: DC
Start: 2019-05-16 — End: 2021-11-22

## 2019-05-16 NOTE — Telephone Encounter (Signed)
-----   Message from Milinda Antis, DO sent at 05/16/2019  9:19 AM EDT -----  Officially diabetic with A1C at 6.5. No need for medication yet, but very important to get his weight down. Do need to increase atorvastatin to 20 mg daily.k Recheck labs in 3 months.

## 2019-05-16 NOTE — Telephone Encounter (Signed)
Patient notified of lab work.  Makaia Rappa, LPN

## 2019-06-04 ENCOUNTER — Encounter (INDEPENDENT_AMBULATORY_CARE_PROVIDER_SITE_OTHER): Payer: Self-pay | Admitting: Family Medicine

## 2019-07-22 ENCOUNTER — Other Ambulatory Visit (INDEPENDENT_AMBULATORY_CARE_PROVIDER_SITE_OTHER): Payer: Self-pay | Admitting: Family Medicine

## 2019-08-19 ENCOUNTER — Ambulatory Visit (HOSPITAL_COMMUNITY): Payer: Self-pay | Admitting: Family Medicine

## 2019-08-19 ENCOUNTER — Ambulatory Visit: Payer: BC Managed Care – PPO | Attending: Family Medicine

## 2019-08-19 DIAGNOSIS — R05 Cough: Secondary | ICD-10-CM

## 2019-08-19 DIAGNOSIS — R059 Cough, unspecified: Secondary | ICD-10-CM

## 2019-08-19 DIAGNOSIS — Z20828 Contact with and (suspected) exposure to other viral communicable diseases: Secondary | ICD-10-CM

## 2019-08-19 DIAGNOSIS — U071 COVID-19: Secondary | ICD-10-CM | POA: Insufficient documentation

## 2019-08-19 NOTE — Telephone Encounter (Signed)
COVID-19 SCREENING NOTE    Primary Care Provider: Milinda Antis, DO     Has the patient had recent travel to/from or exposure to someone with recent travel to a COVID-19 "hot spot"? No travel, no exposure  Has the patient had recent exposure to a known case of COVID-19: No  Has the patient had fever? No  Has the patient had cough? Yes  Has the patient had shortness of breath? No  Additional symptoms? +chills, +headache, congestion     Patient Active Problem List   Diagnosis   . Asthma   . Hypertension   . Palpitations   . Obesity   . Sinusitis, chronic   . Lymphoma ([facility])   . Hyperlipidemia         Occupation: Data Unavailable    Disposition:   SCREENING INDICATED.  Order to be placed. SCREENING SITE: [facility].  Patient phone number and address confirmed:    [phone]  [address]  [place] [address].         Urban Gibson, RN

## 2019-08-19 NOTE — Patient Instructions (Signed)
Thank you for visiting our drive through Coronavirus testing site.     You or your family member is a Person Under Investigation (called a PUI). As a PUI you are considered contagious, until the swab comes back telling you/us that you do not have Covid-19.    Here is what you must do now:  . Return home, no stops to shop or social events;  . Stay at home in isolation;  . No visitors to your home;  . Call you provider if you are worse to ask for guidance.    Results:  . We anticipate your result will be available in 7 days.  . Results will be sent to [email or web address].  If you do not have access to [email or web address], please go here to sign-up: [email or web address]  . Someone from our team will contact you when your result is available.  . You will be given further instructions about how long to stay in isolation at that time.    For the most current information on Coronavirus (COVID-19), please check these sites:   Medicine:  [email or web address]    Centers for Disease Control:  https://www.cdc.gov/coronavirus/2019-nCoV/index.html

## 2019-08-23 ENCOUNTER — Telehealth (INDEPENDENT_AMBULATORY_CARE_PROVIDER_SITE_OTHER): Payer: Self-pay | Admitting: Family Medicine

## 2019-08-23 LAB — COVID-19 SCREENING - SEND-OUT: SARS-COV-2 RNA: DETECTED — AB

## 2019-08-23 NOTE — Progress Notes (Signed)
Patient called regarding positive covid results. He is feeling better overall. Instructed to quarantine for a total of 10 days from test date. Instructed to go to the Er with sob

## 2019-09-02 ENCOUNTER — Other Ambulatory Visit (INDEPENDENT_AMBULATORY_CARE_PROVIDER_SITE_OTHER): Payer: Self-pay | Admitting: Family Medicine

## 2019-09-02 ENCOUNTER — Telehealth (INDEPENDENT_AMBULATORY_CARE_PROVIDER_SITE_OTHER): Payer: Self-pay | Admitting: Family Medicine

## 2019-09-02 MED ORDER — ALBUTEROL SULFATE HFA 90 MCG/ACTUATION AEROSOL INHALER
1.0000 | INHALATION_SPRAY | Freq: Four times a day (QID) | RESPIRATORY_TRACT | 1 refills | Status: DC | PRN
Start: 2019-09-02 — End: 2019-10-28

## 2019-09-02 NOTE — Telephone Encounter (Signed)
Patient called ans said that he needed a refill on his inhaler while recovering from Covid. I could not find one in his chart. He said it was red and gave him jitters after he took it. Thanks. Jess. Forestine Na, RN  09/02/2019, 08:47

## 2019-09-02 NOTE — Telephone Encounter (Signed)
If SOB, needs to go to the ER. Okay to call him in albuterol inhaler 1-2 puffs every 6 hours prn. #1.

## 2019-09-19 ENCOUNTER — Inpatient Hospital Stay (HOSPITAL_COMMUNITY)
Admission: RE | Admit: 2019-09-19 | Payer: BC Managed Care – PPO | Source: Ambulatory Visit | Admitting: Neurological Surgery

## 2019-10-27 ENCOUNTER — Other Ambulatory Visit: Payer: Self-pay

## 2019-10-27 ENCOUNTER — Emergency Department
Admission: EM | Admit: 2019-10-27 | Discharge: 2019-10-28 | Disposition: A | Discharge status: Home / Self Care | Payer: BC Managed Care – PPO | Attending: EMERGENCY MEDICINE | Admitting: EMERGENCY MEDICINE

## 2019-10-27 DIAGNOSIS — Z8572 Personal history of non-Hodgkin lymphomas: Secondary | ICD-10-CM | POA: Insufficient documentation

## 2019-10-27 DIAGNOSIS — E669 Obesity, unspecified: Secondary | ICD-10-CM | POA: Insufficient documentation

## 2019-10-27 DIAGNOSIS — N390 Urinary tract infection, site not specified: Secondary | ICD-10-CM

## 2019-10-27 DIAGNOSIS — R350 Frequency of micturition: Secondary | ICD-10-CM | POA: Insufficient documentation

## 2019-10-27 DIAGNOSIS — Z6838 Body mass index (BMI) 38.0-38.9, adult: Secondary | ICD-10-CM | POA: Insufficient documentation

## 2019-10-27 DIAGNOSIS — R300 Dysuria: Secondary | ICD-10-CM | POA: Insufficient documentation

## 2019-10-27 DIAGNOSIS — Z792 Long term (current) use of antibiotics: Secondary | ICD-10-CM | POA: Insufficient documentation

## 2019-10-27 LAB — COMPREHENSIVE METABOLIC PANEL, NON-FASTING
ALBUMIN: 4 g/dL (ref 3.2–4.6)
ALKALINE PHOSPHATASE: 95 U/L (ref 20–130)
ALT (SGPT): 18 U/L (ref <=52)
ANION GAP: 11 mmol/L
AST (SGOT): 15 U/L (ref <=35)
BILIRUBIN TOTAL: 0.5 mg/dL (ref 0.3–1.2)
BUN/CREA RATIO: 16
BUN: 17 mg/dL (ref 10–25)
CALCIUM: 8.8 mg/dL (ref 8.8–10.3)
CHLORIDE: 105 mmol/L (ref 98–111)
CO2 TOTAL: 23 mmol/L (ref 21–35)
CREATININE: 1.08 mg/dL (ref <=1.30)
ESTIMATED GFR: >60 mL/min/{1.73_m2}
GLUCOSE: 123 mg/dL — ABNORMAL HIGH (ref 70–110)
POTASSIUM: 3.8 mmol/L (ref 3.5–5.0)
PROTEIN TOTAL: 6.7 g/dL (ref 6.0–8.3)
SODIUM: 139 mmol/L (ref 135–145)

## 2019-10-27 LAB — URINALYSIS, MACRO/MICRO
BILIRUBIN: NEGATIVE mg/dL
GLUCOSE: NEGATIVE mg/dL
NITRITE: POSITIVE — AB
PH: 6 (ref 5.0–7.0)
PROTEIN: NEGATIVE mg/dL
SPECIFIC GRAVITY: 1.025 (ref 1.010–1.025)
UROBILINOGEN: 0.2 mg/dL

## 2019-10-27 MED ORDER — CEFAZOLIN 1 GRAM/50 ML IN DEXTROSE (ISO-OSMOTIC) INTRAVENOUS PIGGYBACK
1.0000 g | INJECTION | Freq: Once | INTRAVENOUS | Status: AC
Start: 2019-10-28 — End: 2019-10-28
  Administered 2019-10-28: 1 g via INTRAVENOUS
  Administered 2019-10-28: 0 g via INTRAVENOUS
  Filled 2019-10-27 (×2): qty 50

## 2019-10-27 MED ORDER — CEFAZOLIN 1 GRAM/50 ML IN DEXTROSE (ISO-OSMOTIC) INTRAVENOUS PIGGYBACK
1.0000 g | INJECTION | Freq: Once | INTRAVENOUS | Status: DC
Start: 2019-10-28 — End: 2019-10-27

## 2019-10-27 NOTE — ED Provider Notes (Signed)
[facility]  Emergency Department  Resident Note    Identification:  Name: Jason Ball  Age and Gender: 59 y.o. male  Date of Birth:   Date of Service: 10/27/2019  MRN: [id number]  PCP: Milinda Antis, DO    Code Status: Full    Arrival: The patient arrived by private car and is accompanied by son  History Obtained from: Patient. EMR. .  History Limitations: none    CC:  Urinary frequency      HPI:  Jason Ball is a 59 y.o. White male with a MHx of  NHL and Urinary frequency, states that he developed urinary frequency and urgency for the last several days. Was given an RX for Bactrim and continues to have symptoms. He endorses pyuria and incomplete emptying. He denies any headaches, changes in vision, chest pain, chest tightness, fevers, chills, back pain or joint pain. He denies any recent hospitalizations or instrementizations.     REVIEW OF SYSTEMS:  General: (+) pain. (-) fevers (-) chills. (-) weight loss. (-) fatigue.  Psychiatric: (-) Depression. (-) anxiety. (-) insomnia (-) SI/HI/AVH, (-) EtOH, (-) Tobacco, (-) Marijuana, (-) Cocaine, (-) Amphetamines (-) Heroin, (-) opiates  Neurologic: (-) headaches. (-) neuropathy. (-) weakness. (-) memory problems, (-) Confusion, (-) Focal deficits, (-) parasthesia  Lymphatic: (-) palpable masses. (-) night sweats.  Heme: (-) easy bruising (-) bleeding. (-) recurrent infections.   HEENT. (-) vision changes (-) hearing changes. (-) dysphagia. (-) sore throat.   Heart: (-) chest pain. (-) palpitation. (-) orthopnea. (-) LE edema.   Lungs: (-) dyspnea (on exertion) (-) hemoptysis. (-) cough.   Abdomen: (-) poor appetite. (-) abdominal pain. (-) nausea (-) vomiting. (-) diarrhea. (-) constipation.   GU: (+) dysuria (-) Urgency. (-) Hematuria.   MS. (-) joint pain (-) ext swelling. (-) Back pain.   Dermatologic: (-) rashes. (-) pruritus, (-) Lymphadenopathy      All other systems reviewed and are negative.    Pertinent Physical Exam:     GEN: No acute distress      NURO: No s/s of FND   PSYCH: Appropriate for interaction   CVS: RRR, no overt murmurs, no lower limb edema   PULM: no s/s of usage of accessory muscles of respiration  ABDM: Obese, soft, NT, NG, NR, BS+, no CVA tenderness bilaterally.   GU: Chaperone present: normal appearing male genitalia, no overt pathologies, suprapubic tenderness noted.  MSK: Can move all 4 limbs independently   SKIN: No signs of rashes or excoriations on exposed surfaces of skin         Course:   UA concerning for UTI, reviewed lab, patient has documented UTI resistant to bactrim.   CMP: glucose 12  CBC diff pending    Patient seen and examined.   Vital signs stable and within normal limits.  Physical exam as above.  Patient given ancef 1g.  Upon reexamination, patient doing better.   Results were discussed with the patient and patient's family (when available).   They were given the opportunity to ask questions.   Patient and family agreeable to plan.    Consults:   None    Disposition:    Please see Course Note for further patient care information. Following the above history, physical exam, and studies, the patient was deemed stable and suitable for discharge and he will follow up in 1 days with PCP.  Prescriptions: Keflex 500mg  QID x 6 days. Medication instructions were discussed with  the patient/patient's family as applicable . Patient/patient's family was advised to return to the ED with any new, concerning or worsening symptoms and follow up as directed. The patient/patient's family verbalized understanding of all instructions and had no further questions or concerns.       Orders:  Results up to the Time the Disposition was Entered   URINALYSIS, MACRO/MICRO - Abnormal; Notable for the following components:       Result Value    LEUKOCYTES Moderate (*)     NITRITE Positive (*)     KETONES Trace (*)     BLOOD Small (*)     RBCS 3-10 (*)     WBCS TNTC (*)     BACTERIA Rare (*)     All other components within normal limits   URINALYSIS  WITH REFLEX MICROSCOPIC AND CULTURE IF POSITIVE    Narrative:     The following orders were created for panel order URINALYSIS WITH REFLEX MICROSCOPIC AND CULTURE IF POSITIVE.  Procedure                               Abnormality         Status                     ---------                               -----------         ------                     URINALYSIS, MACRO/MICRO[[id number]]      Abnormal            Final result                 Please view results for these tests on the individual orders.   COMPREHENSIVE METABOLIC PANEL, NON-FASTING   URINE CULTURE,ROUTINE       Medications:  Medications reviewed with patient/patient's family.  Medications Prior to Admission     Prescriptions    albuterol sulfate (PROAIR HFA) 90 mcg/actuation Inhalation HFA Aerosol Inhaler    Take 1-2 Puffs by inhalation Every 6 hours as needed    Amlodipine-Benazepril 10-40 mg Oral Capsule    Take 1 capsule by mouth once daily    aspirin (ECOTRIN) 81 mg Oral Tablet, Delayed Release (E.C.)    Take 81 mg by mouth Once a day    atorvastatin (LIPITOR) 20 mg Oral Tablet    Take 1 Tab (20 mg total) by mouth Once a day    docusate sodium (COLACE) 100 mg Oral Capsule    Take 100 mg by mouth Twice daily    naproxen (NAPROSYN) 500 mg Oral Tablet    Take 1 Tab (500 mg total) by mouth Twice per day as needed for Pain          Allergies:  Allergies reviewed with patient/patient's family.  Allergies   Allergen Reactions   . No Known Allergies        PMSFSH:  Past medical, surgical, family and social history reviewed with patient/patient's family.  Past Medical History:   Diagnosis Date   . Asthma     well controlled without medications   . Blurred vision    . HTN (hypertension)    . Hyperlipidemia 02/02/2017   . Hypertension    .  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 Obesity    . Palpitations     with anxiety   . Ringing in ears    . Sinusitis, chronic    . Tattoos     x1 left upper arm   . Upper respiratory infection     sinusitis 2 weeks ago - cleared up     Past  Surgical History:   Procedure Laterality Date   . Hx adenoidectomy     . Hx colonoscopy     . Hx eye surgery     . Hx lymph node dissection     . Hx tonsillectomy       Family History   Problem Relation Age of Onset   . Cancer Father         unknown   . Other Mother         early Parkinson's   . No Known Problems Brother    . No Known Problems Son    . No Known Problems Daughter    . No Known Problems Son    . HTN <20 y.o. Other    . Heart Disease Other      Social History     Socioeconomic History   . Marital status: Married     Spouse name: Not on file   . Number of children: 3   . Years of education: 68   . Highest education level: Not on file   Occupational History   . Occupation: self employed    Tobacco Use   . Smoking status: Never Smoker   . Smokeless tobacco: Never Used   Substance and Sexual Activity   . Alcohol use: Yes     Comment: 1 beer every 3 months   . Drug use: No   . Sexual activity: Not on file   Other Topics Concern   . Ability to Walk 1 Flight of Steps without SOB/CP Yes   . Routine Exercise No   . Ability to Walk 2 Flight of Steps without SOB/CP Yes   . Unable to Ambulate Not Asked   . Total Care Not Asked   . Ability To Do Own ADL's Yes   . Uses Walker Not Asked   . Other Activity Level Yes     Comment: mechanic activities   . Uses Cane Not Asked   . Abuse/Domestic Violence Not Asked   . Caffeine Concern Not Asked   . Calcium intake adequate Not Asked   . Computer Use Not Asked   . Drives Not Asked   . Exercise Concern Not Asked   . Helmet Use Not Asked   . Seat Belt Not Asked   . Special Diet Not Asked   . Sunscreen used Not Asked   . Uses Cane No   . Uses walker No   . Uses wheelchair No   . Right hand dominant Yes   . Left hand dominant Not Asked   . Ambidextrous Not Asked   . Shift Work Not Asked   . Unusual Sleep-Wake Schedule Not Asked   Social History Narrative    Denies use of OTC substances (caffeine pills, pseudoephedrine products, dextromethorphan products, and herbal  preparations). Denies past/current misuse of prescription medications (opioids, sedatives, stimulants). Denies marijuana use. Denies past/current use of illicit drugs (cocaine, heroin, methamphetamine, hallucinogens, and inhalants).          Social Determinants of Health     Financial Resource Strain:    .  Difficulty of Paying Living Expenses: Not on file   Food Insecurity:    . Worried About Charity fundraiser in the Last Year: Not on file   . Ran Out of Food in the Last Year: Not on file   Transportation Needs:    . Lack of Transportation (Medical): Not on file   . Lack of Transportation (Non-Medical): Not on file   Physical Activity:    . Days of Exercise per Week: Not on file   . Minutes of Exercise per Session: Not on file   Stress:    . Feeling of Stress : Not on file   Intimate Partner Violence:    . Fear of Current or Ex-Partner: Not on file   . Emotionally Abused: Not on file   . Physically Abused: Not on file   . Sexually Abused: Not on file       Jonah Blue MD MPH MS  PGY-3  Pager # [phone]  10/27/2019 and 23:22

## 2019-10-27 NOTE — ED Attending Note (Signed)
ADDENDUM:    I performed a history and physical examination of the patient and discussed his/her management with the resident.  I reviewed the resident's note and agree with the documented findings and plan of care with the following additions / exceptions    Charolotte Eke, MD 10/27/2019, 23:08    HPI:    59 y.o. male presents with urinary frequency.  He states he was recently diagnosed with the in a tract infection and given Bactrim however his symptoms have persisted.  Patient does deny fever, chills, nausea and vomiting.      PE:  BP (!) 177/80   Pulse (!) 101   Temp 36.5 C (97.7 F)   Resp 18   Wt 125 kg (276 lb)   SpO2 97%   BMI 38.49 kg/m       Patient afebrile, vital signs stable, no acute distress  Heent-Normal, atraumatic, neck nontender  CVS-Tachycardia,  no murmur  Lungs-clear, no wheezing or rhonchi  Abdomen-soft, non tender, no rebound or guarding  Neuro-no gross deficits, normal sensation  Skin-no rash or lacerations    Labs:  Reviewed    Encounter Diagnoses   Name Primary?   . UTI (urinary tract infection) Yes   . Urinary frequency    . Dysuria      Discharged

## 2019-10-28 ENCOUNTER — Inpatient Hospital Stay (HOSPITAL_COMMUNITY): Payer: BC Managed Care – PPO | Admitting: Internal Medicine

## 2019-10-28 ENCOUNTER — Encounter (HOSPITAL_BASED_OUTPATIENT_CLINIC_OR_DEPARTMENT_OTHER): Payer: Self-pay | Admitting: HEMATOLOGY/ONCOLOGY

## 2019-10-28 ENCOUNTER — Encounter (HOSPITAL_COMMUNITY): Payer: Self-pay

## 2019-10-28 ENCOUNTER — Inpatient Hospital Stay
Admission: EM | Admit: 2019-10-28 | Discharge: 2019-10-30 | DRG: 872 | Disposition: A | Discharge status: Home / Self Care | Payer: BC Managed Care – PPO | Attending: Internal Medicine | Admitting: Internal Medicine

## 2019-10-28 ENCOUNTER — Emergency Department (EMERGENCY_DEPARTMENT_HOSPITAL): Payer: BC Managed Care – PPO

## 2019-10-28 ENCOUNTER — Encounter (INDEPENDENT_AMBULATORY_CARE_PROVIDER_SITE_OTHER): Payer: Self-pay | Admitting: Family Medicine

## 2019-10-28 ENCOUNTER — Ambulatory Visit (INDEPENDENT_AMBULATORY_CARE_PROVIDER_SITE_OTHER): Payer: BC Managed Care – PPO | Admitting: Family Medicine

## 2019-10-28 VITALS — BP 158/94 | HR 106 | Temp 96.0°F | Resp 17 | Ht 71.5 in | Wt 283.0 lb

## 2019-10-28 DIAGNOSIS — E785 Hyperlipidemia, unspecified: Secondary | ICD-10-CM

## 2019-10-28 DIAGNOSIS — Z79899 Other long term (current) drug therapy: Secondary | ICD-10-CM

## 2019-10-28 DIAGNOSIS — N136 Pyonephrosis: Secondary | ICD-10-CM | POA: Diagnosis present

## 2019-10-28 DIAGNOSIS — Z8572 Personal history of non-Hodgkin lymphomas: Secondary | ICD-10-CM

## 2019-10-28 DIAGNOSIS — N3289 Other specified disorders of bladder: Secondary | ICD-10-CM

## 2019-10-28 DIAGNOSIS — R339 Retention of urine, unspecified: Secondary | ICD-10-CM

## 2019-10-28 DIAGNOSIS — N390 Urinary tract infection, site not specified: Secondary | ICD-10-CM

## 2019-10-28 DIAGNOSIS — I10 Essential (primary) hypertension: Secondary | ICD-10-CM

## 2019-10-28 DIAGNOSIS — J45909 Unspecified asthma, uncomplicated: Secondary | ICD-10-CM | POA: Diagnosis present

## 2019-10-28 DIAGNOSIS — B961 Klebsiella pneumoniae [K. pneumoniae] as the cause of diseases classified elsewhere: Secondary | ICD-10-CM | POA: Diagnosis present

## 2019-10-28 DIAGNOSIS — R109 Unspecified abdominal pain: Secondary | ICD-10-CM

## 2019-10-28 DIAGNOSIS — N16 Renal tubulo-interstitial disorders in diseases classified elsewhere: Secondary | ICD-10-CM

## 2019-10-28 DIAGNOSIS — N1330 Unspecified hydronephrosis: Secondary | ICD-10-CM

## 2019-10-28 DIAGNOSIS — A419 Sepsis, unspecified organism: Principal | ICD-10-CM

## 2019-10-28 DIAGNOSIS — Z7982 Long term (current) use of aspirin: Secondary | ICD-10-CM

## 2019-10-28 DIAGNOSIS — N12 Tubulo-interstitial nephritis, not specified as acute or chronic: Secondary | ICD-10-CM

## 2019-10-28 HISTORY — DX: Tubulo-interstitial nephritis, not specified as acute or chronic: N12

## 2019-10-28 LAB — CBC WITH DIFF
BASOPHIL #: 0.1 10*3/uL (ref 0.00–0.20)
BASOPHIL #: <0.1 10*3/uL (ref <=0.20)
BASOPHIL %: 0 %
BASOPHIL %: 1 %
EOSINOPHIL #: 0.2 10*3/uL (ref 0.00–0.50)
EOSINOPHIL #: <0.1 10*3/uL (ref <=0.50)
EOSINOPHIL %: 1 %
EOSINOPHIL %: 1 %
HCT: 42.3 % (ref 38.9–50.5)
HCT: 44 % (ref 38.9–52.0)
HGB: 14 g/dL (ref 13.4–17.3)
HGB: 14.4 g/dL (ref 13.4–17.5)
IMMATURE GRANULOCYTE #: <0.1 10*3/uL (ref <0.10)
IMMATURE GRANULOCYTE %: 1 % (ref 0–1)
LYMPHOCYTE #: 1.51 10*3/uL (ref 1.00–4.80)
LYMPHOCYTE #: 2.7 10*3/uL (ref 0.80–3.20)
LYMPHOCYTE %: 12 %
LYMPHOCYTE %: 18 %
MCH: 28.7 pg (ref 26.0–32.0)
MCH: 29.3 pg (ref 27.9–33.1)
MCHC: 32.7 g/dL (ref 31.0–35.5)
MCHC: 33.2 g/dL (ref 32.8–36.0)
MCV: 87.6 fL (ref 78.0–100.0)
MCV: 88.3 fL (ref 82.4–95.0)
MONOCYTE #: 1.37 10*3/uL — ABNORMAL HIGH (ref 0.20–1.10)
MONOCYTE #: 1.5 10*3/uL — ABNORMAL HIGH (ref 0.20–0.80)
MONOCYTE %: 10 %
MONOCYTE %: 11 %
MPV: 10.2 fL (ref 8.7–12.5)
MPV: 8.1 fL (ref 6.0–10.2)
NEUTROPHIL #: 10.9 10*3/uL — ABNORMAL HIGH (ref 1.60–5.50)
NEUTROPHIL #: 9.92 10*3/uL — ABNORMAL HIGH (ref 1.50–7.70)
NEUTROPHIL %: 71 %
NEUTROPHIL %: 75 %
PLATELETS: 297 10*3/uL (ref 140–440)
PLATELETS: 371 10*3/uL (ref 150–400)
RBC: 4.79 10*6/uL (ref 4.40–5.68)
RBC: 5.02 10*6/uL (ref 4.50–6.10)
RDW-CV: 13.4 % (ref 11.5–15.5)
RDW: 14 % (ref 10.9–15.1)
WBC: 13 10*3/uL — ABNORMAL HIGH (ref 3.7–11.0)
WBC: 15.4 10*3/uL — ABNORMAL HIGH (ref 3.3–9.3)

## 2019-10-28 LAB — URINALYSIS, MACROSCOPIC
BILIRUBIN: NEGATIVE mg/dL
BLOOD: NEGATIVE mg/dL
COLOR: NORMAL
GLUCOSE: NEGATIVE mg/dL
KETONES: 5 mg/dL — AB
NITRITE: POSITIVE — AB
PH: 5 (ref 5.0–8.0)
PROTEIN: 30 mg/dL — AB
SPECIFIC GRAVITY: 1.024 (ref 1.005–1.030)
UROBILINOGEN: NEGATIVE mg/dL

## 2019-10-28 LAB — BASIC METABOLIC PANEL
ANION GAP: 12 mmol/L (ref 4–13)
BUN/CREA RATIO: 13 (ref 6–22)
BUN: 15 mg/dL (ref 8–25)
CALCIUM: 10 mg/dL (ref 8.5–10.2)
CHLORIDE: 105 mmol/L (ref 96–111)
CO2 TOTAL: 23 mmol/L (ref 22–32)
CREATININE: 1.18 mg/dL (ref 0.62–1.27)
ESTIMATED GFR: >60 mL/min/{1.73_m2} (ref >60)
GLUCOSE: 122 mg/dL (ref 65–139)
POTASSIUM: 4.6 mmol/L (ref 3.5–5.1)
SODIUM: 140 mmol/L (ref 136–145)

## 2019-10-28 LAB — URINALYSIS, MICROSCOPIC
RBCS: 3 /HPF (ref <6.0)
WBCS: 51 /HPF — ABNORMAL HIGH (ref <4.0)

## 2019-10-28 MED ORDER — ENOXAPARIN 40 MG/0.4 ML SUBCUTANEOUS SYRINGE
40.0000 mg | INJECTION | SUBCUTANEOUS | Status: DC
Start: 2019-10-28 — End: 2019-10-30
  Administered 2019-10-28 – 2019-10-29 (×2): 40 mg via SUBCUTANEOUS
  Filled 2019-10-28 (×2): qty 0.4

## 2019-10-28 MED ORDER — HYDROCODONE 5 MG-ACETAMINOPHEN 325 MG TABLET
1.0000 | ORAL_TABLET | ORAL | Status: DC | PRN
Start: 2019-10-28 — End: 2019-10-30

## 2019-10-28 MED ORDER — AMLODIPINE 5 MG TABLET
5.0000 mg | ORAL_TABLET | Freq: Every day | ORAL | Status: DC
Start: 2019-10-29 — End: 2019-10-29
  Administered 2019-10-29: 09:00:00 5 mg via ORAL
  Filled 2019-10-28: qty 1

## 2019-10-28 MED ORDER — SODIUM CHLORIDE 0.9 % (FLUSH) INJECTION SYRINGE
2.0000 mL | INJECTION | INTRAMUSCULAR | Status: DC | PRN
Start: 2019-10-28 — End: 2019-10-30
  Administered 2019-10-30: 04:00:00 2 mL

## 2019-10-28 MED ORDER — PIPERACILLIN-TAZOBACTAM 4.5 GRAM INTRAVENOUS SOLUTION
4.5000 g | Freq: Three times a day (TID) | INTRAVENOUS | Status: DC
Start: 2019-10-29 — End: 2019-10-30
  Administered 2019-10-29: 0 g via INTRAVENOUS
  Administered 2019-10-29: 20:00:00 4.5 g via INTRAVENOUS
  Administered 2019-10-29 (×2): 0 g via INTRAVENOUS
  Administered 2019-10-29 (×2): 4.5 g via INTRAVENOUS
  Administered 2019-10-30: 08:00:00 0 g via INTRAVENOUS
  Administered 2019-10-30: 4.5 g via INTRAVENOUS
  Filled 2019-10-28 (×4): qty 20

## 2019-10-28 MED ORDER — SODIUM CHLORIDE 0.9 % (FLUSH) INJECTION SYRINGE
2.0000 mL | INJECTION | Freq: Three times a day (TID) | INTRAMUSCULAR | Status: DC
Start: 2019-10-28 — End: 2019-10-30
  Administered 2019-10-28 – 2019-10-29 (×3): 2 mL
  Administered 2019-10-29 – 2019-10-30 (×2): 0 mL

## 2019-10-28 MED ORDER — LISINOPRIL 10 MG TABLET
20.0000 mg | ORAL_TABLET | Freq: Every day | ORAL | Status: DC
Start: 2019-10-29 — End: 2019-10-30
  Administered 2019-10-29 – 2019-10-30 (×2): 20 mg via ORAL
  Filled 2019-10-28 (×2): qty 2

## 2019-10-28 MED ORDER — SODIUM CHLORIDE 0.9 % IV BOLUS
1000.00 mL | INJECTION | Status: AC
Start: 2019-10-28 — End: 2019-10-28
  Administered 2019-10-28: 16:00:00 0 mL via INTRAVENOUS
  Administered 2019-10-28: 16:00:00 1000 mL via INTRAVENOUS

## 2019-10-28 MED ORDER — CEPHALEXIN 500 MG CAPSULE
500.00 mg | ORAL_CAPSULE | Freq: Four times a day (QID) | ORAL | 0 refills | Status: DC
Start: 2019-10-28 — End: 2019-10-28

## 2019-10-28 MED ORDER — ATORVASTATIN 10 MG TABLET
20.0000 mg | ORAL_TABLET | Freq: Every day | ORAL | Status: DC
Start: 2019-10-29 — End: 2019-10-30
  Administered 2019-10-29 – 2019-10-30 (×2): 20 mg via ORAL
  Filled 2019-10-28 (×2): qty 2

## 2019-10-28 MED ORDER — PHENAZOPYRIDINE 100 MG TABLET
200.00 mg | ORAL_TABLET | Freq: Three times a day (TID) | ORAL | Status: DC | PRN
Start: 2019-10-28 — End: 2019-10-30
  Filled 2019-10-28: qty 2

## 2019-10-28 MED ORDER — ONDANSETRON HCL (PF) 4 MG/2 ML INJECTION SOLUTION
4.00 mg | INTRAMUSCULAR | Status: AC
Start: 2019-10-28 — End: 2019-10-28
  Administered 2019-10-28: 4 mg via INTRAVENOUS
  Filled 2019-10-28: qty 2

## 2019-10-28 MED ORDER — IOPAMIDOL 300 MG IODINE/ML (61 %) INTRAVENOUS SOLUTION
100.0000 mL | INTRAVENOUS | Status: AC
Start: 2019-10-28 — End: 2019-10-28
  Administered 2019-10-28: 15:00:00 100 mL via INTRAVENOUS

## 2019-10-28 MED ORDER — ACETAMINOPHEN 325 MG TABLET
650.0000 mg | ORAL_TABLET | ORAL | Status: DC | PRN
Start: 2019-10-28 — End: 2019-10-30
  Administered 2019-10-28: 21:00:00 650 mg via ORAL
  Filled 2019-10-28: qty 2

## 2019-10-28 MED ORDER — ASPIRIN 81 MG TABLET,DELAYED RELEASE
81.0000 mg | DELAYED_RELEASE_TABLET | Freq: Every day | ORAL | Status: DC
Start: 2019-10-29 — End: 2019-10-30
  Administered 2019-10-29 – 2019-10-30 (×2): 81 mg via ORAL
  Filled 2019-10-28 (×2): qty 1

## 2019-10-28 MED ORDER — ONDANSETRON HCL (PF) 4 MG/2 ML INJECTION SOLUTION
4.0000 mg | Freq: Four times a day (QID) | INTRAMUSCULAR | Status: DC | PRN
Start: 2019-10-28 — End: 2019-10-30

## 2019-10-28 MED ORDER — SODIUM CHLORIDE 0.9 % INTRAVENOUS PIGGYBACK
4.5000 g | INJECTION | INTRAVENOUS | Status: AC
Start: 2019-10-28 — End: 2019-10-28
  Administered 2019-10-28: 0 g via INTRAVENOUS
  Administered 2019-10-28: 21:00:00 4.5 g via INTRAVENOUS
  Filled 2019-10-28: qty 20

## 2019-10-28 MED ORDER — SODIUM CHLORIDE 0.9 % INTRAVENOUS SOLUTION
INTRAVENOUS | Status: DC
Start: 2019-10-28 — End: 2019-10-29

## 2019-10-28 MED ORDER — PHENAZOPYRIDINE 200 MG TABLET
200.00 mg | ORAL_TABLET | Freq: Three times a day (TID) | ORAL | 0 refills | Status: DC
Start: 2019-10-28 — End: 2021-11-22

## 2019-10-28 MED ORDER — CEFTRIAXONE 1 GRAM/50 ML IN DEXTROSE (ISO-OSMOT) INTRAVENOUS PIGGYBACK
1.0000 g | INJECTION | INTRAVENOUS | Status: AC
Start: 2019-10-28 — End: 2019-10-28
  Administered 2019-10-28: 16:00:00 1 g via INTRAVENOUS
  Administered 2019-10-28: 16:00:00 0 g via INTRAVENOUS

## 2019-10-28 NOTE — ED Nurses Note (Signed)
Labs obtained, collected and sent to lab. Warm blankets provided. Patient denies any other needs or concerns at this time.

## 2019-10-28 NOTE — ED Nurses Note (Signed)
Maxie Better, MD at bedside.

## 2019-10-28 NOTE — Ancillary Notes (Signed)
Patient discussed with ED for evaluation for admission and has been assigned to Dr David Shi at this time. Please call that team with any questions.

## 2019-10-28 NOTE — Progress Notes (Signed)
[facility]  [address] [place] [address]  Dept Phone: [phone]  Dept Fax: [phone]    Wolfe Rentz    [id number]    Date of Service: 10/28/2019   Chief complaint:   Chief Complaint   Patient presents with   . Urinary Symptoms       Subjective:   C/o burning with urination 3 days ago. Little nausea. Has progressivlely gotten worse over the past 3 days. Had fever, but that is better. Sitting makes it worse. No flank pain. Some rectal pressure. Had some constipation, but took some gummies yesterday which helped and had BM yesterday.     Patient Active Problem List    Diagnosis   . Hyperlipidemia   . Lymphoma (CMS [place])   . Asthma   . Hypertension   . Palpitations   . Obesity   . Sinusitis, chronic     Social History     Socioeconomic History   . Marital status: Married     Spouse name: Not on file   . Number of children: 3   . Years of education: 57   . Highest education level: Not on file   Occupational History   . Occupation: self employed    Tobacco Use   . Smoking status: Never Smoker   . Smokeless tobacco: Never Used   Substance and Sexual Activity   . Alcohol use: Yes     Comment: 1 beer every 3 months   . Drug use: No   . Sexual activity: Not on file   Other Topics Concern   . Ability to Walk 1 Flight of Steps without SOB/CP Yes   . Routine Exercise No   . Ability to Walk 2 Flight of Steps without SOB/CP Yes   . Unable to Ambulate Not Asked   . Total Care Not Asked   . Ability To Do Own ADL's Yes   . Uses Walker Not Asked   . Other Activity Level Yes     Comment: mechanic activities   . Uses Cane Not Asked   . Abuse/Domestic Violence Not Asked   . Caffeine Concern Not Asked   . Calcium intake adequate Not Asked   . Computer Use Not Asked   . Drives Not Asked   . Exercise Concern Not Asked   . Helmet Use Not Asked   . Seat Belt Not Asked   . Special Diet Not Asked   . Sunscreen used Not Asked   . Uses Cane No   . Uses walker No   . Uses wheelchair No   . Right hand  dominant Yes   . Left hand dominant Not Asked   . Ambidextrous Not Asked   . Shift Work Not Asked   . Unusual Sleep-Wake Schedule Not Asked   Social History Narrative    Denies use of OTC substances (caffeine pills, pseudoephedrine products, dextromethorphan products, and herbal preparations). Denies past/current misuse of prescription medications (opioids, sedatives, stimulants). Denies marijuana use. Denies past/current use of illicit drugs (cocaine, heroin, methamphetamine, hallucinogens, and inhalants).          Social Determinants of Health     Financial Resource Strain:    . Difficulty of Paying Living Expenses: Not on file   Food Insecurity:    . Worried About Charity fundraiser in the Last Year: Not on file   . Ran Out of Food in the Last Year: Not on file  Transportation Needs:    . Film/video editor (Medical): Not on file   . Lack of Transportation (Non-Medical): Not on file   Physical Activity:    . Days of Exercise per Week: Not on file   . Minutes of Exercise per Session: Not on file   Stress:    . Feeling of Stress : Not on file   Intimate Partner Violence:    . Fear of Current or Ex-Partner: Not on file   . Emotionally Abused: Not on file   . Physically Abused: Not on file   . Sexually Abused: Not on file     Family Medical History:     Problem Relation (Age of Onset)    Cancer Father    HTN <20 y.o. Other    Heart Disease Other    No Known Problems Brother, Son, Daughter, Son    Other Mother            Current Outpatient Medications   Medication Sig   . albuterol sulfate (PROAIR HFA) 90 mcg/actuation Inhalation HFA Aerosol Inhaler Take 1-2 Puffs by inhalation Every 6 hours as needed   . Amlodipine-Benazepril 10-40 mg Oral Capsule Take 1 capsule by mouth once daily   . aspirin (ECOTRIN) 81 mg Oral Tablet, Delayed Release (E.C.) Take 81 mg by mouth Once a day   . atorvastatin (LIPITOR) 20 mg Oral Tablet Take 1 Tab (20 mg total) by mouth Once a day   . docusate sodium (COLACE) 100 mg Oral Capsule  Take 100 mg by mouth Twice daily   . naproxen (NAPROSYN) 500 mg Oral Tablet Take 1 Tab (500 mg total) by mouth Twice per day as needed for Pain   . phenazopyridine (PYRIDIUM) 200 mg Oral Tablet Take 1 Tablet (200 mg total) by mouth Three times a day       Objective:     BP (!) 158/94   Pulse (!) 106   Temp 35.6 C (96 F)   Resp 17   Ht 1.816 m (5' 11.5")   Wt 128 kg (283 lb)   SpO2 97%   BMI 38.92 kg/m       BP Readings from Last 3 Encounters:   10/28/19 (!) 158/94   10/27/19 (!) 177/80   05/15/19 132/84     Wt Readings from Last 3 Encounters:   10/28/19 128 kg (283 lb)   10/27/19 125 kg (276 lb)   05/15/19 128 kg (283 lb)     General appearance: alert, oriented x 3, in his normal state, cooperative, not in apparent distress, appearing stated age   23:  Eyes:  Pupils equal round react like accommodation extraocular muscles intact. Ears within normal limits throat clear, tonsils normal, no cervical lymphadenopathy thyroid normal  Lungs: clear to auscultation bilaterally, respirations non-labored  Heart: regular rate and rhythm, S1, S2 normal, no murmur, PMI non-diffuse  Abdomen: distended. Lower abdominal pain bilaterally. . Bowel sounds normal.  Extremities: extremities normal, atraumatic, no cyanosis or edema, pulses intact in upper and lower extremities    Assessment and Plan     Kasir was seen today for urinary symptoms.    Diagnoses and all orders for this visit:    Abdominal pain, unspecified abdominal location  Er visit consistent with UTI, but pain is a little higher than i would expect with urine source. Will get stat CT scan to make sure everything looks okay. WIll try some pyridium to see if helps with symptoms. Needs to get back  to the ER if worsens.   -     CT ABDOMEN PELVIS W/WO IV CONTRAST; Future    Other orders  -     phenazopyridine (PYRIDIUM) 200 mg Oral Tablet; Take 1 Tablet (200 mg total) by mouth Three times a day                  Orders Placed This Encounter   . CT ABDOMEN PELVIS  W/WO IV CONTRAST   . phenazopyridine (PYRIDIUM) 200 mg Oral Tablet       S. "Paula Compton, D.O.

## 2019-10-28 NOTE — ED Nurses Note (Signed)
Pt's son asking for VS to be retaken. VS improved from triage.

## 2019-10-28 NOTE — ED Nurses Note (Addendum)
Straight cathed for 1200 urine as per order. Re bladder scanned for 3cc of urine. Service notified. Patient tolerated well. Patient says he feels relief.

## 2019-10-28 NOTE — ED Nurses Note (Signed)
Verbal report called to RN. Awaiting transport.

## 2019-10-28 NOTE — ED Attending Note (Signed)
Department of Emergency Medicine      I was physically present and directly supervised this patient's care.  Patient was seen and examined.  The resident's history and exam were reviewed.  Key elements in addition to and/or correction of that documentation are as follows:    Vitals   ED Triage Vitals [10/28/19 1103]   BP (Non-Invasive) (!) 169/111   Heart Rate (!) 118   Respiratory Rate 18   Temperature 36.9 C (98.4 F)   SpO2 96 %   Weight 129 kg (284 lb 6.3 oz)   Height 1.803 m (5\' 11" )       This is a 59 y.o. male with dysuria, subjective fever/chills, back pain, inability to completely void.         Old records were reviewed.    Improved with straight cath drainage.  Awaiting CT

## 2019-10-28 NOTE — ED Nurses Note (Signed)
Handoff report received from previous RN, assuming care at this time.  Pt c/o headache, denies needs for medication at this time.

## 2019-10-28 NOTE — ED Nurses Note (Signed)
Patient discharged home. Ambulated from facility with son without difficulty Thomes Lolling, RN  10/28/2019, 01:00

## 2019-10-28 NOTE — ED Nurses Note (Signed)
Patient resting on stretcher with eyes closed; RR 18. Foley cath continues draining without difficulty. No s/s any distress. Son at bedside.

## 2019-10-28 NOTE — Consults (Signed)
[place]  INITIAL CONSULT NOTE    Patient: Jason Ball, Jason Ball, 59 y.o. male  MRN: [id number]  Date of Admission:  10/28/2019  Date of Service:  10/28/2019  Date of Birth:    PCP: Milinda Antis, DO.  Information obtained from: Patient, health care provider, history reviewed via medical record  Consult requested by: Hospitalist 5     Consult for: acute urinary retention and complicated UTI     HPI:    Jason Ball is a 59 y.o. male with a history of nephrolithiasis, HTN, HLD who presented to the ED as a transfer from an outside hospital after he presented there complaining of inability to void, severe lower abdominal pain, and bilateral flank pain.  Patient mentioned that 3 days ago he started having fevers and chills and he was prescribed antibiotic by his PCP.  His started having difficulty urinating 2 days ago.  When he was seen in the outside hospital earlier today CT scan was done and showed bilateral mild hydroureteronephrosis with severely distended bladder and left renal nonobstructing stone and possible infected posterior urethral stone.  His white count was elevated and his urine was positive for infection.  Foley catheter was placed at outside hospital drained large amount of urine.  Patient mentioned that he felt better immediately after Foley placement.  He was transferred here for higher level of care.  Urology was consulted for evaluation.     ROS:     ROS Other than ROS in the HPI, all other systems were negative.    PAST MEDICAL/ FAMILY/ SOCIAL HISTORY:    PAST MEDICAL:    Past Medical History:   Diagnosis Date    Asthma     well controlled without medications    Blurred vision     HTN (hypertension)     Hyperlipidemia 02/02/2017    Hypertension     Lymphoma (CMS HCC)     Obesity     Palpitations     with anxiety    Pyelonephritis 10/28/2019    Ringing in ears     Sinusitis, chronic     Tattoos     x1 left upper arm    Upper respiratory infection     sinusitis  2 weeks ago - cleared up        Past Surgical History:   Procedure Laterality Date    HX ADENOIDECTOMY      HX COLONOSCOPY      HX EYE SURGERY      eyelid lift - 2013    HX LYMPH NODE DISSECTION      HX TONSILLECTOMY              Medications Prior to Admission       Prescriptions    Amlodipine-Benazepril 10-40 mg Oral Capsule    Take 1 capsule by mouth once daily    aspirin (ECOTRIN) 81 mg Oral Tablet, Delayed Release (E.C.)    Take 81 mg by mouth Once a day    atorvastatin (LIPITOR) 20 mg Oral Tablet    Take 1 Tab (20 mg total) by mouth Once a day    docusate sodium (COLACE) 100 mg Oral Capsule    Take 100 mg by mouth Twice daily    naproxen (NAPROSYN) 500 mg Oral Tablet    Take 1 Tab (500 mg total) by mouth Twice per day as needed for Pain    phenazopyridine (PYRIDIUM) 200 mg Oral Tablet  Take 1 Tablet (200 mg total) by mouth Three times a day          Allergies   Allergen Reactions    No Known Allergies        Family History  Family Medical History:       Problem Relation (Age of Onset)    Cancer Father    HTN <20 y.o. Other    Heart Disease Other    No Known Problems Brother, Son, Daughter, Son    Other Mother            Social History  Social History     Socioeconomic History    Marital status: Married     Spouse name: Not on file    Number of children: 3    Years of education: 14    Highest education level: Not on file   Occupational History    Occupation: self employed    Tobacco Use    Smoking status: Never Smoker    Smokeless tobacco: Never Used   Substance and Sexual Activity    Alcohol use: Yes     Comment: 1 beer every 3 months    Drug use: No   Other Topics Concern    Ability to Walk 1 Flight of Steps without SOB/CP Yes    Routine Exercise No    Ability to Walk 2 Flight of Steps without SOB/CP Yes    Ability To Do Own ADL's Yes    Other Activity Level Yes     Comment: Dealer activities    Uses Cane No    Uses walker No    Uses wheelchair No    Right hand dominant Yes   Social History Narrative     Denies use of OTC substances (caffeine pills, pseudoephedrine products, dextromethorphan products, and herbal preparations). Denies past/current misuse of prescription medications (opioids, sedatives, stimulants). Denies marijuana use. Denies past/current use of illicit drugs (cocaine, heroin, methamphetamine, hallucinogens, and inhalants).          Social Determinants of Health     Financial Resource Strain:     Difficulty of Paying Living Expenses: Not on file   Food Insecurity:     Worried About Charity fundraiser in the Last Year: Not on file    [employer] of Food in the Last Year: Not on file   Transportation Needs:     Lack of Transportation (Medical): Not on file    Lack of Transportation (Non-Medical): Not on file   Physical Activity:     Days of Exercise per Week: Not on file    Minutes of Exercise per Session: Not on file   Stress:     Feeling of Stress : Not on file   Intimate Partner Violence:     Fear of Current or Ex-Partner: Not on file    Emotionally Abused: Not on file    Physically Abused: Not on file    Sexually Abused: Not on file        PHYSICAL EXAMINATION:    Temperature: 37.7 C (99.8 F)  Heart Rate: (!) 109  BP (Non-Invasive): (!) 157/98  Respiratory Rate: 16  SpO2: 94 %  Pain Score (Numeric, Faces): 10  Constitutional (e.g. vital signs, general appearance) - 59 y.o. male, NAD  Eyes: Conjunctiva clear, sclera white   Ears, nose, mouth, and throat -  Trachea midline. Patient has two ears. Nose is midline, nostrils appear patent. Moist mucous membranes.    Cardiovascular -  Pulse palpable.  Respiratory - Unlabored respiratory effort  Abdomen - Soft, non-distended, non-tender  Musculoskeletal - All four extremities present  Skin - Warm and dry  Neurological - CN II-XII grossly intact, A&O x 3  Psychiatric - Normal affect  Hematologic/lymphatic/immunologic - No petechiae.  Genitourinary - foley catheter in place draining clear urine output, no SP fullness or tenderness, no CVA tenderness          Labs Ordered/ Reviewed   Reviewed: Labs:  Lab Results for Last 24 Hours:   Results for orders placed or performed during the hospital encounter of 10/28/19 (from the past 24 hour(s))   URINALYSIS, MACROSCOPIC   Result Value Ref Range    SPECIFIC GRAVITY 1.024 1.005 - 1.030    GLUCOSE Negative Negative mg/dL    PROTEIN 30  (A) Negative mg/dL    BILIRUBIN Negative Negative mg/dL    UROBILINOGEN Negative Negative mg/dL    PH 5.0 5.0 - 8.0    BLOOD Negative Negative mg/dL    KETONES 5  (A) Negative mg/dL    NITRITE Positive (A) Negative    LEUKOCYTES Small (A) Negative WBCs/uL    APPEARANCE Cloudy (A) Clear    COLOR Normal (Yellow) Normal (Yellow)   URINALYSIS, MICROSCOPIC   Result Value Ref Range    WBCS 51.0 (H) <4.0 /hpf    RBCS 3.0 <6.0 /hpf    BACTERIA Many (A) Occasional or less /hpf    SQUAMOUS EPITHELIAL CELLS Occasional or less Occasional or less /lpf    MUCOUS Light Light /lpf   BASIC METABOLIC PANEL   Result Value Ref Range    SODIUM 140 136 - 145 mmol/L    POTASSIUM 4.6 3.5 - 5.1 mmol/L    CHLORIDE 105 96 - 111 mmol/L    CO2 TOTAL 23 22 - 32 mmol/L    ANION GAP 12 4 - 13 mmol/L    CALCIUM 10.0 8.5 - 10.2 mg/dL    GLUCOSE 122 65 - 139 mg/dL    BUN 15 8 - 25 mg/dL    CREATININE 1.18 0.62 - 1.27 mg/dL    BUN/CREA RATIO 13 6 - 22    ESTIMATED GFR >60 >60 mL/min/1.51m^2   CBC WITH DIFF   Result Value Ref Range    WBC 13.0 (H) 3.7 - 11.0 x10^3/uL    RBC 5.02 4.50 - 6.10 x10^6/uL    HGB 14.4 13.4 - 17.5 g/dL    HCT 44.0 38.9 - 52.0 %    MCV 87.6 78.0 - 100.0 fL    MCH 28.7 26.0 - 32.0 pg    MCHC 32.7 31.0 - 35.5 g/dL    RDW-CV 13.4 11.5 - 15.5 %    PLATELETS 371 150 - 400 x10^3/uL    MPV 10.2 8.7 - 12.5 fL    NEUTROPHIL % 75 %    LYMPHOCYTE % 12 %    MONOCYTE % 11 %    EOSINOPHIL % 1 %    BASOPHIL % 0 %    NEUTROPHIL # 9.92 (H) 1.50 - 7.70 x10^3/uL    LYMPHOCYTE # 1.51 1.00 - 4.80 x10^3/uL    MONOCYTE # 1.37 (H) 0.20 - 1.10 x10^3/uL    EOSINOPHIL # <0.10 <=0.50 x10^3/uL    BASOPHIL # <0.10 <=0.20 x10^3/uL     IMMATURE GRANULOCYTE % 1 0 - 1 %    IMMATURE GRANULOCYTE # <0.10 <0.10 x10^3/uL   Results for orders placed or performed during the hospital encounter of 10/27/19 (from the past  24 hour(s))   URINALYSIS, MACRO/MICRO   Result Value Ref Range    COLOR Yellow Yellow, Straw, Colorless    APPEARANCE Cloudy Clear, Cloudy    SPECIFIC GRAVITY 1.025 1.010 - 1.025    PH 6.0 5.0 - 7.0    LEUKOCYTES Moderate (A) Negative WBCs/uL    NITRITE Positive (A) Negative    PROTEIN Negative Negative, Trace mg/dL    GLUCOSE Negative Negative mg/dL    KETONES Trace (A) Negative mg/dL    UROBILINOGEN 0.2  Normal, 0.2 , 1.0 mg/dL    BILIRUBIN Negative Negative mg/dL    BLOOD Small (A) Negative mg/dL    RBCS 3-10 (A) None /hpf    WBCS TNTC (A) None /hpf    BACTERIA Rare (A) None /hpf    SQUAMOUS EPITHELIAL None None, Rare, Occasional /hpf    HYALINE CASTS Few None, Rare, Few, Occasional, Many /lpf   COMPREHENSIVE METABOLIC PANEL, NON-FASTING   Result Value Ref Range    SODIUM 139 135 - 145 mmol/L    POTASSIUM 3.8 3.5 - 5.0 mmol/L    CHLORIDE 105 98 - 111 mmol/L    CO2 TOTAL 23 21 - 35 mmol/L    ANION GAP 11 mmol/L    BUN 17 10 - 25 mg/dL    CREATININE 1.08 <=1.30 mg/dL    BUN/CREA RATIO 16     ESTIMATED GFR >60 Avg: 93 mL/min/1.24m^2    ALBUMIN 4.0 3.2 - 4.6 g/dL    CALCIUM 8.8 8.8 - 10.3 mg/dL    GLUCOSE 123 (H) 70 - 110 mg/dL    ALKALINE PHOSPHATASE 95 20 - 130 U/L    ALT (SGPT) 18 <=52 U/L    AST (SGOT) 15 <=35 U/L    BILIRUBIN TOTAL 0.5 0.3 - 1.2 mg/dL    PROTEIN TOTAL 6.7 6.0 - 8.3 g/dL   CBC WITH DIFF   Result Value Ref Range    WBC 15.4 (H) 3.3 - 9.3 x10^3/uL    RBC 4.79 4.40 - 5.68 x10^6/uL    HGB 14.0 13.4 - 17.3 g/dL    HCT 42.3 38.9 - 50.5 %    MCV 88.3 82.4 - 95.0 fL    MCH 29.3 27.9 - 33.1 pg    MCHC 33.2 32.8 - 36.0 g/dL    RDW 14.0 10.9 - 15.1 %    PLATELETS 297 140 - 440 x10^3/uL    MPV 8.1 6.0 - 10.2 fL    NEUTROPHIL % 71 %    LYMPHOCYTE % 18 %    MONOCYTE % 10 %    EOSINOPHIL % 1 %    BASOPHIL % 1 %    NEUTROPHIL # 10.90 (H)  1.60 - 5.50 x10^3/uL    LYMPHOCYTE # 2.70 0.80 - 3.20 x10^3/uL    MONOCYTE # 1.50 (H) 0.20 - 0.80 x10^3/uL    EOSINOPHIL # 0.20 0.00 - 0.50 x10^3/uL    BASOPHIL # 0.10 0.00 - 0.20 x10^3/uL        Radiology Tests Ordered/ Reviewed    Results for orders placed or performed during the hospital encounter of 10/28/19   CT ABDOMEN PELVIS W IV CONTRAST     Status: None    Narrative    Jason Ball  Male, 59 years old.    CT ABDOMEN PELVIS W IV CONTRAST performed on 10/28/2019 3:30 PM.    REASON FOR EXAM:  urinary retention, +UA, back pain, Hx stones - c/f  obstruction    RADIATION DOSE: 1896.80 mGycm  CONTRAST: 100 ml's of Isovue 300    TECHNIQUE:    COMPARISON: July 28, 2013    FINDINGS: Lung bases are clear. No pleural effusion seen. Liver is mildly  fatty infiltrated. Gallbladder appears unremarkable. Spleen and pancreas  appear normal.    Both right and left kidneys show mild hydronephrosis and hydroureter. There  is left nephrolithiasis without evidence for obstructing stone. No left  ureteral stone identified. Urinary bladder is very distended. No stones  seen within the urinary bladder. No specific evidence for pyelonephritis  seen on either side. There are some prostate calcifications. No definite  urethral stone identified.    There is no bowel obstruction identified. Appendix not identified. There is  uncomplicated diverticulosis.    Patient has numerous small and stable appearing lymph nodes along the aorta  and bilateral iliac vessels. These were present back in 2014. No specific  evidence for adenopathy identified.      Impression    Marked distention of the urinary bladder. Mild bilateral hydronephrosis and  hydroureter possibly due to reflux from a distended urinary bladder. No  evidence for obstructing stone. Nonobstructing left nephrolithiasis and  mild left perinephric fat stranding without overt evidence for  pyelonephritis.            Impression:   59 y.o. male with:    1. Acute urinary retention  secondary to posterior urethral stone   2. Complicated UTI  3. Left renal small non-obstructing stone     Recommendations:    Start IV antibiotic for complicated UTI treatment and adjust according to culture results   Maintain foley catheter  Patient will need cystoscopy and cystolitholapaxy for removal of stone posterior urethra. That will be done after treatment of UTI (most likely next week)   Patient will maintain his foley catheter upon discharge until his procedure with urology   No intervention indicated for left renal stone at this time           Please call/page [facility] with any questions, concerns, or changes in the interim     Carlena Hurl, MD   [facility]  Department of Urology   10/28/2019, 21:32        Late entry for 10/28/2019. I saw and examined the patient.  I reviewed the resident's note.  I agree with the findings and plan of care as documented in the resident's note.  Any exceptions/additions are edited/noted.    Weldon Inches, MD

## 2019-10-28 NOTE — ED Nurses Note (Addendum)
Patient complaining of chills; denies any pain at this time. Temp 99.4. Service notified.

## 2019-10-28 NOTE — H&P (Addendum)
[facility]  General Medicine  Admission H&P    Date of Service:  10/28/2019  Peretz, Rodas, 59 y.o. male  Date of Admission:  10/28/2019  Date of Birth:    PCP: Milinda Antis, DO          Information Obtained from: patient  Chief Complaint:  groin pain, urinary symptoms    HPI: Pt is a 59yo male with hx most notable for NHL, recent treatment for UTI; presents with bilateral flank pain, suprapubic pain of several days duration.  He also endorses nausea and inability to completely void.  He was recently given bactrim called in by PCP, and then then keflex last evening by [facility] ED for treatment of presumptive UTI.  He was never admitted anywhere.    UA yesterday at [facility] showed Positive nitrites, Moderate leukesterase, too numerous to count WBC.  UA today showed Positive nitrites, small leukocytes, 51 WBC, Many bacteria.  Bladder scan today showed 1L+ and got 1L out on straight cath.     Abd/Pelvis CT showed markedly distended bladder, mild bilateral hydro and bilateral hydroureter.  Non-obstructing L stone and mild L perinephric fat stranding without evidence of pyelo.    On interview he reported dramatic rapid improvement of L flank pain after straight cath'ed. Still with mild R flank pain, and suprapubic pain.    He denies any prior history of urinary symptoms prior to this last week.  On further inteview, however, he endorsed a hospital admission for prostatitis several years ago.    ROS: 12pt review of systems was negative except as otherwise described in the HPI.      PAST MEDICAL:    Past Medical History:   Diagnosis Date   . Asthma     well controlled without medications   . Blurred vision    . HTN (hypertension)    . Hyperlipidemia 02/02/2017   . Hypertension    . Lymphoma ([facility])    . Obesity    . Palpitations     with anxiety   . Ringing in ears    . Sinusitis, chronic    . Tattoos     x1 left upper arm   . Upper respiratory infection     sinusitis 2 weeks ago - cleared up        Past Surgical  History:   Procedure Laterality Date   . HX ADENOIDECTOMY     . HX COLONOSCOPY     . HX EYE SURGERY      eyelid lift - 2013   . HX LYMPH NODE DISSECTION     . HX TONSILLECTOMY              Medications Prior to Admission     Prescriptions    albuterol sulfate (PROAIR HFA) 90 mcg/actuation Inhalation HFA Aerosol Inhaler    Take 1-2 Puffs by inhalation Every 6 hours as needed    Amlodipine-Benazepril 10-40 mg Oral Capsule    Take 1 capsule by mouth once daily    aspirin (ECOTRIN) 81 mg Oral Tablet, Delayed Release (E.C.)    Take 81 mg by mouth Once a day    atorvastatin (LIPITOR) 20 mg Oral Tablet    Take 1 Tab (20 mg total) by mouth Once a day    docusate sodium (COLACE) 100 mg Oral Capsule    Take 100 mg by mouth Twice daily    naproxen (NAPROSYN) 500 mg Oral Tablet    Take 1 Tab (500  mg total) by mouth Twice per day as needed for Pain    phenazopyridine (PYRIDIUM) 200 mg Oral Tablet    Take 1 Tablet (200 mg total) by mouth Three times a day        Allergies   Allergen Reactions   . No Known Allergies        Family History  Family Medical History:     Problem Relation (Age of Onset)    Cancer Father    HTN <20 y.o. Other    Heart Disease Other    No Known Problems Brother, Son, Daughter, Son    Other Mother        Social History  Does not attest to drinking alcohol on a daily basis.    Examination:  Temperature: 37.7 C (99.8 F) Heart Rate: (!) 110 BP (Non-Invasive): (!) 157/77   Respiratory Rate: 18 SpO2: 93 % Pain Score (Numeric, Faces): 10   Gen:  Alert, fully awake.  On bed in NAD.  HEENT: MMM.  NCAT.  No scleral icterus.  CV: RRR.  No LE edema.  Pulm: CTAB.  Breathing is non-labored.  No accessory muscle use.  GI: Abd NT ND  GU: Bilateral mild flank pain.  Groin pain.  Skin: No rash  Neuro: No focal deficits appreciated on gross exam.  Moving all extremities independently and voluntarily.  MSK: Upper and lower extremities are without gross deformities.  Psych: Appropriate affect.    Labs, Data, and  Studies:  Reviewed the most recent lab data.  Reviewed the most recent radiological studies.  Reviewed the most recent medical studies.  Reviewed the latest consultant notes.    Assessment/Plan:   There are no active hospital problems to display for this patient.      1. Sepsis, secondary to pyelonephritis:    - Further management as below.    2. UTI, L pyelonephritis:  UA quite dirty.  Got bactrim and keflex prior.  Likely exacerbated by urinary retention at level of bladder outlet. L perinephric stranding concerning for pyelonephritis is noted.  L non-obstructing stone is also concerning for seeding.  - IV Zosyn.  - Urine culture pending.  No urine culture result thus far.  - Insert Foley.  - Consult Urology.  - IVF 80/hr.    3. Distended bladder, inability to void:  Indwelling foley for now.  On CT scan, some calcifications of prostate are noted.  - Indwelling foley ordered.  - Consult Urology.    4. HTN: Continue amlodipine and ACEI.  5. HLD: Continue statin.    FEN/GI: Regular diet  Prophylaxis: enoxaparin subq  Dispo: Anticipate final dispo will be to HOME.    FULL CODE    Rana Snare, MD    LOS: Admit 3

## 2019-10-28 NOTE — ED Provider Notes (Signed)
[facility]  Emergency Department  Provider Note    Name: Jason Ball  Age and Gender: 59 y.o. male  Date of Birth:   Date of Service: 10/28/2019  MRN: [id number]  PCP: Milinda Antis, DO  Attending: Dr. Dyann Ruddle    Chief Complaint   Patient presents with   . Groin Pain     left flank pain since friday , no hematuria , , sent to by Doctor @ [place] , temp 36.9 therma        Clinical Impression:   No diagnosis found.       HPI:  Jason Ball is a 59 y.o. White male PMH s/f urinary frequency, NHL, asthma, HTN/HLD, p/w urinary retention.    Patient tells me that he has had subjective fevers/chills, and feeling of urinary urgency, for the past 3 days. Hasn't completely voided since last Friday. Also describes worsening nausea and bilateral back pain for the past 24 hours. Patient was seen at [facility] yesterday night, Ua showed moderate leuk's, + nitrites. He was given ancef, d/c'd on keflex. He went to PCP prior to pharmacy and was told to come to ED. Hasn't taken the keflex. Has a history of UTI's, last was 2017 (per chart review - E coli, sensitive to cefazolin, zosyn) and kidney stones (last approx 2 yrs ago).     Bladder scan done here showed >1.2L, pt straight cath'd at bedside.      ED course:   Labs from last night s/f:    WBC 15.4    Ua - moderate leuk's, + nitrites   Ua here showed small leuks, + nitrites    51.0 WBC and many bacteria on microscopy      Therapeutic straight cath done at bedside - 1200cc out   CT AP w. Con - c/f stone/obstructive process   Would consider starting Flomax, continuing Keflex at d/c   Voiding trial following CT scan    Mgmt per afternoon team           ROS:  Constitutional: subjective fevers, chills, fatigue or weakness   Skin: No rash or diaphoresis  HENT: No headaches, or congestion  Eyes: No vision changes or photophobia   Cardio: No chest pain, palpitations or leg swelling   Respiratory: No cough, wheezing or SOB  GI:  No nausea, vomiting, diarrhea, constipation, or  abdominal pain  GU:  No dysuria, hematuria. Yes urinary retention and urgency  MSK: No muscle aches, joint pain. Yes biL back pain.  Neuro: No numbness, tingling, or focal weakness  Psychiatric: No depression, SI or substance abuse  All other systems reviewed and are negative.      Below pertinent information reviewed with patient:  Past Medical History:   Diagnosis Date   . Asthma     well controlled without medications   . Blurred vision    . HTN (hypertension)    . Hyperlipidemia 02/02/2017   . Hypertension    . Lymphoma (CMS [place])    . Obesity    . Palpitations     with anxiety   . Ringing in ears    . Sinusitis, chronic    . Tattoos     x1 left upper arm   . Upper respiratory infection     sinusitis 2 weeks ago - cleared up       Medications Prior to Admission     Prescriptions    albuterol sulfate (PROAIR HFA) 90 mcg/actuation Inhalation HFA Aerosol Inhaler  Take 1-2 Puffs by inhalation Every 6 hours as needed    Amlodipine-Benazepril 10-40 mg Oral Capsule    Take 1 capsule by mouth once daily    aspirin (ECOTRIN) 81 mg Oral Tablet, Delayed Release (E.C.)    Take 81 mg by mouth Once a day    atorvastatin (LIPITOR) 20 mg Oral Tablet    Take 1 Tab (20 mg total) by mouth Once a day    docusate sodium (COLACE) 100 mg Oral Capsule    Take 100 mg by mouth Twice daily    naproxen (NAPROSYN) 500 mg Oral Tablet    Take 1 Tab (500 mg total) by mouth Twice per day as needed for Pain    phenazopyridine (PYRIDIUM) 200 mg Oral Tablet    Take 1 Tablet (200 mg total) by mouth Three times a day          Allergies   Allergen Reactions   . No Known Allergies        Past Surgical History:   Procedure Laterality Date   . HX ADENOIDECTOMY     . HX COLONOSCOPY     . HX EYE SURGERY      eyelid lift - 2013   . HX LYMPH NODE DISSECTION     . HX TONSILLECTOMY         Family Medical History:     Problem Relation (Age of Onset)    Cancer Father    HTN <20 y.o. Other    Heart Disease Other    No Known Problems Brother, Son, Daughter, Son       Other Mother          Social History     Socioeconomic History   . Marital status: Married     Spouse name: Not on file   . Number of children: 3   . Years of education: 80   . Highest education level: Not on file   Occupational History   . Occupation: self employed    Tobacco Use   . Smoking status: Never Smoker   . Smokeless tobacco: Never Used   Substance and Sexual Activity   . Alcohol use: Yes     Comment: 1 beer every 3 months   . Drug use: No   . Sexual activity: Not on file   Other Topics Concern   . Ability to Walk 1 Flight of Steps without SOB/CP Yes   . Routine Exercise No   . Ability to Walk 2 Flight of Steps without SOB/CP Yes   . Unable to Ambulate Not Asked   . Total Care Not Asked   . Ability To Do Own ADL's Yes   . Uses Walker Not Asked   . Other Activity Level Yes     Comment: mechanic activities   . Uses Cane Not Asked   . Abuse/Domestic Violence Not Asked   . Caffeine Concern Not Asked   . Calcium intake adequate Not Asked   . Computer Use Not Asked   . Drives Not Asked   . Exercise Concern Not Asked   . Helmet Use Not Asked   . Seat Belt Not Asked   . Special Diet Not Asked   . Sunscreen used Not Asked   . Uses Cane No   . Uses walker No   . Uses wheelchair No   . Right hand dominant Yes   . Left hand dominant Not Asked   . Ambidextrous Not Asked   .  Shift Work Not Asked   . Unusual Sleep-Wake Schedule Not Asked   Social History Narrative    Denies use of OTC substances (caffeine pills, pseudoephedrine products, dextromethorphan products, and herbal preparations). Denies past/current misuse of prescription medications (opioids, sedatives, stimulants). Denies marijuana use. Denies past/current use of illicit drugs (cocaine, heroin, methamphetamine, hallucinogens, and inhalants).          Social Determinants of Health     Financial Resource Strain:    . Difficulty of Paying Living Expenses: Not on file   Food Insecurity:    . Worried About Charity fundraiser in the Last Year: Not on file   .  Ran Out of Food in the Last Year: Not on file   Transportation Needs:    . Lack of Transportation (Medical): Not on file   . Lack of Transportation (Non-Medical): Not on file   Physical Activity:    . Days of Exercise per Week: Not on file   . Minutes of Exercise per Session: Not on file   Stress:    . Feeling of Stress : Not on file   Intimate Partner Violence:    . Fear of Current or Ex-Partner: Not on file   . Emotionally Abused: Not on file   . Physically Abused: Not on file   . Sexually Abused: Not on file         Objective:  ED Triage Vitals [10/28/19 1103]   BP (Non-Invasive) (!) 169/111   Heart Rate (!) 118   Respiratory Rate 18   Temperature 36.9 C (98.4 F)   SpO2 96 %   Weight 129 kg (284 lb 6.3 oz)   Height 1.803 m (5\' 11" )       Nursing notes and vital signs reviewed.    Constitutional: Pleasant 59 y.o. male appears stated age, in moderate distress. Cannot lay back. Clearly uncomfortable  HENT:   Head: Normocephalic and atraumatic.   Mouth/Throat: Oropharynx is moist.   Eyes: EOMI, PERRL   Neck: Trachea midline.  Cardiovascular: RRR, No murmurs, rubs or gallops. Intact distal pulses.  Pulmonary/Chest: BS clear and equal bilaterally. No respiratory distress. No wheezes or rales.  Abdominal: BS +. Abdomen soft, no rebound or guarding, no tenderness  Back: No midline spinal tenderness, no paraspinal tenderness, biL CVA tenderness          Musculoskeletal: No edema, tenderness or deformity.  Skin: Warm and dry. No rash, erythema, or pallor.  Psychiatric: Normal mood and affect. Behavior is normal.   Neurological: Patient alert and responsive, CN II-XII grossly intact, moving all extremities equally and fully.    Labs:   Labs Reviewed   URINALYSIS, MACROSCOPIC - Abnormal; Notable for the following components:       Result Value    PROTEIN 30  (*)     KETONES 5  (*)     NITRITE Positive (*)     LEUKOCYTES Small (*)     APPEARANCE Cloudy (*)     All other components within normal limits   URINALYSIS,  MICROSCOPIC - Abnormal; Notable for the following components:    WBCS 51.0 (*)     BACTERIA Many (*)     All other components within normal limits   CBC WITH DIFF - Abnormal; Notable for the following components:    WBC 13.0 (*)     NEUTROPHIL # 9.92 (*)     MONOCYTE # 1.37 (*)     All other components within normal limits  URINE CULTURE   URINALYSIS, MACROSCOPIC AND MICROSCOPIC W/CULTURE REFLEX    Narrative:     The following orders were created for panel order URINALYSIS, MACROSCOPIC AND MICROSCOPIC W/CULTURE REFLEX.  Procedure                               Abnormality         Status                     ---------                               -----------         ------                     URINALYSIS, MACROSCOPIC[[id number]]      Abnormal            Final result               URINALYSIS, MICROSCOPIC[[id number]]      Abnormal            Final result                 Please view results for these tests on the individual orders.   CBC/DIFF    Narrative:     The following orders were created for panel order CBC/DIFF.  Procedure                               Abnormality         Status                     ---------                               -----------         ------                     CBC WITH SN:[id number]                Abnormal            Final result                 Please view results for these tests on the individual orders.   BASIC METABOLIC PANEL       Imaging:  No orders to display       EKG: n/a    ____________________  Chief Complaint   Patient presents with   . Groin Pain     left flank pain since friday , no hematuria , , sent to by Doctor @ [place] , temp 36.9 therma          MDM/Course:  Jason Ball is a 59 y.o. male PMH s/f urinary frequency, NHL, asthma, HTN/HLD, p/w urinary retention.      Urinary Retention  c/f UTI   Labs from last night s/f:    WBC 15.4    Ua - moderate leuk's, + nitrites   Ua here showed small leuks, + nitrites    51.0 WBC and many bacteria on microscopy      Therapeutic straight  cath done at bedside - 1200cc out   CT  AP w. Con - c/f stone/obstructive process   Would consider starting Flomax, continuing Keflex at d/c    Mgmt per afternoon team                           Medications given:  Medications - No data to display      Care of Jason Ball was checked out to Dr. August Saucer at 14:44   following a discussion of the patient's course. Please refer to this resident's note for further workup and management for this patient.          Disposition: Data Unavailable    New Prescriptions    No medications on file       Follow up: TBD   No follow-up provider specified.  -----------------------------------------------------      Patient seen by and discussed with attending physician, Dr. Dyann Ruddle.    Parts of this patients chart were completed in a retrospective fashion due to simultaneous direct patient care activities in the Emergency Department.   This note was partially generated using MModal Fluency Direct system, and there may be some incorrect words, spellings, and punctuation that were not noted in checking the note before saving.      Jason Better, MD  10/28/2019, 13:16

## 2019-10-28 NOTE — ED Nurses Note (Signed)
Resting on stretcher; No complaints voiced at this time. Son remains at bedside.

## 2019-10-28 NOTE — ED Attending Handoff Note (Addendum)
Care of patient assumed from Dr. Leotis Shames at 14:37 with sent from PCP for evaluation of back pain.  Urinary retention.  Bladder drained with straight cath in ED.  CT pending prior to disposition.  Will need trial of voiding.    Possible foley    Vitals:    10/28/19 1103 10/28/19 1240 10/28/19 1430   BP: (!) 169/111 (!) 160/82 (!) 150/96   Pulse: (!) 118 (!) 106 (!) 112   Resp: 18 18 (!) 22   Temp: 36.9 C (98.4 F) 36.7 C (98.1 F) 37.4 C (99.4 F)   SpO2: 96% 96% 94%   Weight: 129 kg (284 lb 6.3 oz)     Height: 1.803 m (5\' 11" )     BMI: 39.75

## 2019-10-28 NOTE — ED Nurses Note (Signed)
18FR foley cath placed; drained 1750 of clear, yellow/orange (patient took a pyridium). Dr. Mayra Neer instructed not to clamp foley; allow bladder to drain completely per MD. Will continue to monitor.

## 2019-10-28 NOTE — ED Resident Handoff Note (Signed)
[facility]  Emergency Department  Resident Course Note    Patient Name: Jason Ball  Age and Gender: 59 y.o. male  Date of Birth:   Date of Service: 10/28/2019  MRN: [id number]  PCP: Milinda Antis, DO  Attending: Dr Mayra Neer      Allergies   Allergen Reactions   . No Known Allergies        VS:  Temperature: 37.6 C (99.7 F)  Heart Rate: 99  Respiratory Rate: 16  BP (Non-Invasive): (!) 158/89  SpO2: 94 %  Pain Score (Numeric, Faces): 4    Chief Complaint   Patient presents with   . Groin Pain     left flank pain since friday , no hematuria , , sent to by Doctor @ [place] , temp 36.9 therma        HPI:  In brief, patient is a 59 y.o. male who presents to the emergency department due to Groin Pain (left flank pain since friday , no hematuria , , sent to by Doctor @ [place] , temp 36.9 therma ).  Endorses several day hx of urinary frequency, dysuria, and incomplete bladder emptying. UA + for bacteria. Straight cath in ED for 1255m. CT abd pelvis pending.    Pertinent Exam Findings:  See primary    Pertinent Imaging/Lab results:  Labs Reviewed   URINALYSIS, MACROSCOPIC - Abnormal; Notable for the following components:       Result Value    PROTEIN 30  (*)     KETONES 5  (*)     NITRITE Positive (*)     LEUKOCYTES Small (*)     APPEARANCE Cloudy (*)     All other components within normal limits   URINALYSIS, MICROSCOPIC - Abnormal; Notable for the following components:    WBCS 51.0 (*)     BACTERIA Many (*)     All other components within normal limits   CBC WITH DIFF - Abnormal; Notable for the following components:    WBC 13.0 (*)     NEUTROPHIL # 9.92 (*)     MONOCYTE # 1.37 (*)     All other components within normal limits   BASIC METABOLIC PANEL - Normal    Narrative:     Estimated Glomerular Filtration Rate (eGFR) calculated using the CKD-EPI (2009) equation, intended for patients 122years of age and older. If race and/or gender is not documented or "unknown," there will be no eGFR calculation.      URINE CULTURE   ADULT ROUTINE BLOOD CULTURE, SET OF 2 BOTTLES (BACTERIA AND YEAST)   ADULT ROUTINE BLOOD CULTURE, SET OF 2 BOTTLES (BACTERIA AND YEAST)   URINALYSIS, MACROSCOPIC AND MICROSCOPIC W/CULTURE REFLEX    Narrative:     The following orders were created for panel order URINALYSIS, MACROSCOPIC AND MICROSCOPIC W/CULTURE REFLEX.  Procedure                               Abnormality         Status                     ---------                               -----------         ------  URINALYSIS, MACROSCOPIC[[id number]]      Abnormal            Final result               URINALYSIS, MICROSCOPIC[[id number]]      Abnormal            Final result                 Please view results for these tests on the individual orders.   CBC/DIFF    Narrative:     The following orders were created for panel order CBC/DIFF.  Procedure                               Abnormality         Status                     ---------                               -----------         ------                     CBC WITH QGBE[[id number]]                Abnormal            Final result                 Please view results for these tests on the individual orders.       CT ABDOMEN PELVIS W IV CONTRAST   Final Result   Marked distention of the urinary bladder. Mild bilateral hydronephrosis and   hydroureter possibly due to reflux from a distended urinary bladder. No   evidence for obstructing stone. Nonobstructing left nephrolithiasis and   mild left perinephric fat stranding without overt evidence for   pyelonephritis.                    Consults:  none    Interventions Received:  Medications   NS flush syringe (has no administration in time range)     And   NS flush syringe (has no administration in time range)   acetaminophen (TYLENOL) tablet (has no administration in time range)   ondansetron (ZOFRAN) 2 mg/mL injection (has no administration in time range)   enoxaparin PF (LOVENOX) 40 mg/0.4 mL SubQ injection (has no  administration in time range)   NS premix infusion (has no administration in time range)   piperacillin-tazobactam (ZOSYN) 4.5 g in NS 100 mL IVPB (has no administration in time range)   NS bolus infusion 1,000 mL (0 mL Intravenous Stopped 10/28/19 1620)   iopamidol (ISOVUE-300) 61% infusion (100 mL Intravenous Given 10/28/19 1529)   ondansetron (ZOFRAN) 2 mg/mL injection (4 mg Intravenous Given 10/28/19 1600)   cefTRIAXone (ROCEPHIN) 1 g in iso-osmotic 50 mL premix IVPB (0 g Intravenous Stopped 10/28/19 1621)   piperacillin-tazobactam (ZOSYN) 4.5 g in NS 100 mL IVPB (has no administration in time range)       Plan:  medicine admission    After a thorough discussion of the patient including presentation, ED course, and review of above information I have assumed care of Jason Ball from Dr. Warrick Parisian at 14:28  --------------------------------------------------------------------------------------------------------------------------------  Course:  Endorses chills and nausea, oral temp 99.4, +  R CVA tenderness  Zofran for nausea and a dose of Rocephin ordered  Rectal exam negative for prostate tenderness  CT abd pelvis: marked distension of urinary bladder, bilateral hydronephrosis, no overt evidence for pyelonephritis.      Disposition:  inpatient admission  Impression:  Encounter Diagnosis   Name Primary?   . Urinary retention Yes       Parts of this patients chart were completed in a retrospective fashion due to simultaneous direct patient care activities in the Emergency Department.   This note was partially generated using MModal Fluency Direct system, and there may be some incorrect words, spellings, and punctuation that were not noted in checking the note before saving.      August Saucer, MD  [facility]  PGY-1, Transitional Year      This note was partially generated using MModal Fluency Direct system, and there may be some incorrect words, spellings, and punctuation that were not noted in checking the  note before saving.    -----

## 2019-10-28 NOTE — ED Nurses Note (Signed)
Patient presented to the ED with CC of difficulty with urinating and constipation. Patient says he hasn't "really voided since Friday" since then he's only voided small amounts. He is complaining of burning with urination, lower back, and groin discomfort. Noted tenderness to both sides of lower back. Patient also has not had a BM since Friday. He is complaining of nausea and vomited x 1 during assessment. Bladder scanned for > 1424 of urine in bladder. Patient pacing room in pain. Will continue to monitor.

## 2019-10-29 ENCOUNTER — Encounter (HOSPITAL_BASED_OUTPATIENT_CLINIC_OR_DEPARTMENT_OTHER): Payer: Self-pay | Admitting: HEMATOLOGY/ONCOLOGY

## 2019-10-29 DIAGNOSIS — N211 Calculus in urethra: Secondary | ICD-10-CM

## 2019-10-29 LAB — BASIC METABOLIC PANEL
ANION GAP: 8 mmol/L (ref 4–13)
BUN/CREA RATIO: 12 (ref 6–22)
BUN: 10 mg/dL (ref 8–25)
CALCIUM: 8.6 mg/dL (ref 8.5–10.2)
CHLORIDE: 107 mmol/L (ref 96–111)
CO2 TOTAL: 25 mmol/L (ref 22–32)
CREATININE: 0.82 mg/dL (ref 0.62–1.27)
ESTIMATED GFR: >60 mL/min/{1.73_m2} (ref >60)
GLUCOSE: 110 mg/dL (ref 65–139)
POTASSIUM: 3.8 mmol/L (ref 3.5–5.1)
SODIUM: 140 mmol/L (ref 136–145)

## 2019-10-29 LAB — CBC
HCT: 39.2 % (ref 38.9–52.0)
HGB: 12.9 g/dL — ABNORMAL LOW (ref 13.4–17.5)
MCH: 29.1 pg (ref 26.0–32.0)
MCHC: 32.9 g/dL (ref 31.0–35.5)
MCV: 88.5 fL (ref 78.0–100.0)
MPV: 9.9 fL (ref 8.7–12.5)
PLATELETS: 345 10*3/uL (ref 150–400)
RBC: 4.43 10*6/uL — ABNORMAL LOW (ref 4.50–6.10)
RDW-CV: 13.7 % (ref 11.5–15.5)
WBC: 9.9 10*3/uL (ref 3.7–11.0)

## 2019-10-29 LAB — HGA1C (HEMOGLOBIN A1C WITH EST AVG GLUCOSE)
ESTIMATED AVERAGE GLUCOSE: 120 mg/dL
HEMOGLOBIN A1C: 5.8 % — ABNORMAL HIGH (ref 4.0–5.6)

## 2019-10-29 MED ORDER — AMLODIPINE 10 MG TABLET
10.0000 mg | ORAL_TABLET | Freq: Every day | ORAL | Status: DC
Start: 2019-10-30 — End: 2019-10-30
  Administered 2019-10-30: 08:00:00 10 mg via ORAL
  Filled 2019-10-29: qty 1

## 2019-10-29 MED ORDER — AMLODIPINE 5 MG TABLET
5.00 mg | ORAL_TABLET | ORAL | Status: AC
Start: 2019-10-29 — End: 2019-10-29
  Administered 2019-10-29: 5 mg via ORAL
  Filled 2019-10-29: qty 1

## 2019-10-29 NOTE — Care Management Notes (Signed)
[facility] Management Initial Evaluation    Patient Name: Jason Ball  Date of Birth:   Sex: male  Date/Time of Admission: 10/28/2019 12:41 PM  Room/Bed: 32/A  Payor: BLUE CROSS BLUE SHIELD / Plan: [place] BC/BS PPO / Product Type: PPO /   Primary Care Providers:  Milinda Antis, DO, DO (General)  Harmon Dun, MD, MD   (Oncology -  Primary)    Pharmacy Info:   Preferred Pharmacy       [place] [place] [facility]    [place] [address]    Phone: [phone] Fax: [phone]    Hours: Not open 24 hours          Emergency Contact Info:   Extended Emergency Contact Information  Primary Emergency Contact: Raatz,MELODY  Address: [address]           [place], [place] [address] [place] of [place] Phone: [phone]  Work Phone: [phone]  Mobile Phone: [phone]  Relation: Wife  Preferred language: English    History:   Jason Ball is a 59 y.o., male.    Height/Weight: 180.3 cm ('5\' 11"' ) / 125 kg (276 lb 7.3 oz)     LOS: 1 day   Admitting Diagnosis: Pyelonephritis [N12]    Assessment:      10/29/19 1038   Assessment Details   Assessment Type Admission   Date of Care Management Update 10/29/19   Date of Next DCP Update 10/31/19   Readmission   Is this a readmission? No   Care Management Plan   Discharge Planning Status initial meeting   Discharge plan discussed with: Patient   CM will evaluate for rehabilitation potential yes   Discharge Needs Assessment   Equipment Currently Used at Home none   Equipment Needed After Discharge none   Discharge Facility/Level of Care Needs Home (Patient/Family Member/other)(code 1)   Transportation Available family or friend will provide   Referral Information   Admission Type inpatient   Address Verified verified-no changes   Arrived From home or self-care   Insurance Verified verified-no change   ADVANCE DIRECTIVES   Does the Patient have an Advance Directive? No, Information Offered  and Refused   LAY CAREGIVER    Appointed Lay Caregiver? I Decline   Employment/Financial   Patient has Prescription Coverage?  Yes        Name of Insurance Coverage for Medications BCBS   Financial Concerns none   Living Environment   Select an age group to open "lives with" row.  Adult   Lives With spouse   Living Arrangements house   Able to Return to Prior Arrangements yes   Home Safety   Home Assessment: No Problems Identified   Home Accessibility no concerns       Discharge Plan:  Home (Patient/Family Member/other) (code 1)    [place] met with the patient for the initial care management evaluation.  The patient reports he lives with his spouse.  He has transportation back home upon discharge.  He does not use any DME and is not active with [facility] services.  He plans on returning home once medically ready, with no anticipated discharge needs identified.    The patient will continue to be evaluated for developing discharge needs.     Case Manager: Thurnell Lose, [place] COORDINATOR  Phone: [phone]

## 2019-10-29 NOTE — Care Plan (Signed)
Problem: Adult Inpatient Plan of Care  Goal: Plan of Care Review  Outcome: Ongoing (see interventions/notes)  Goal: Patient-Specific Goal (Individualized)  Outcome: Ongoing (see interventions/notes)  Goal: Absence of Hospital-Acquired Illness or Injury  Outcome: Ongoing (see interventions/notes)  Goal: Optimal Comfort and Wellbeing  Outcome: Ongoing (see interventions/notes)  Goal: Rounds/Family Conference  Outcome: Ongoing (see interventions/notes)     Problem: Fall Injury Risk  Goal: Absence of Fall and Fall-Related Injury  Outcome: Ongoing (see interventions/notes)     Problem: Pain Acute  Goal: Acceptable Pain Control and Functional Ability  Outcome: Ongoing (see interventions/notes)   Pt is resting in bed, denies pain at this time. Foley is draining by gravity. Fall prevention in place. Call bell within reach.

## 2019-10-29 NOTE — Transitional Care (Signed)
[facility] Medicine   Transitional Care Coordination   Initial Assessment       Name: Hamdaan Koegler   Date of Birth:  59 y.o.  Date of service: 10/29/2019  Lay Caregiver:  ,  ,         1. Patient appointment preferences: morning  2. Transportation to healthcare appointments:yes  3. PCP listed as Milinda Antis, DO. This is correct per pt.. Last visit: recent. Obtained verbal consent frompt to contact PCP.   4. Patient MyChart status: active  5. Preferred method of contact: 9057/mychart  Confirmed patient contact information:   Home Phone [phone]   Work Phone [phone]   Mobile [phone]       Spoke with pt.to discuss provider of primary care services.  Discussed role of Transitional Care Coordination Team and informed of contact within 2 business days of discharge to assess follow-up plan.      Lesly Dukes, LPN  [id number], [id number]

## 2019-10-29 NOTE — Progress Notes (Signed)
[facility]  Medicine Progress Note  Full Code    Jason Ball  Date of service: 10/29/2019    Subjective:   Patient states he feels much improved since urinary catheter placement and antibiotics started. He had been feeling nauseated, subjective fever, and abdominal pressure. Looking forward to eating breakfast, asks about his glucose control.    Vital Signs:  Temp (24hrs) Max:37.7 C (Q000111Q F)      Systolic (123XX123), [id number] , Min:142 , 123XX123     Diastolic (123XX123), Q000111Q, Min:77, Max:111    Temp  Avg: 37.2 C (98.9 F)  Min: 36.6 C (97.9 F)  Max: 37.7 C (99.8 F)  MAP (Non-Invasive)  Avg: 105.8 mmHG  Min: 96 mmHG  Max: 114 mmHG  Pulse  Avg: 106.3  Min: 85  Max: 118  Resp  Avg: 18.2  Min: 16  Max: 22  SpO2  Avg: 93.7 %  Min: 91 %  Max: 96 %  Pain Score (Numeric, Faces): 0    I/O:  I/O last 24 hours:      Intake/Output Summary (Last 24 hours) at 10/29/2019 0716  Last data filed at 10/29/2019 0300  Gross per 24 hour   Intake --   Output 3600 ml   Net -3600 ml     I/O current shift:  No intake/output data recorded.    acetaminophen (TYLENOL) tablet, 650 mg, Oral, Q4H PRN  amLODIPine (NORVASC) tablet, 5 mg, Oral, Daily  aspirin (ECOTRIN) enteric coated tablet 81 mg, 81 mg, Oral, Daily  atorvastatin (LIPITOR) tablet, 20 mg, Oral, Daily  enoxaparin PF (LOVENOX) 40 mg/0.4 mL SubQ injection, 40 mg, Subcutaneous, Q24H  HYDROcodone-acetaminophen (NORCO) 5-325 mg per tablet, 1 Tablet, Oral, Q4H PRN  lisinopril (PRINIVIL) tablet, 20 mg, Oral, Daily  NS flush syringe, 2-6 mL, Intracatheter, Q8HRS   And  NS flush syringe, 2-6 mL, Intracatheter, Q1 MIN PRN  NS premix infusion, , Intravenous, Continuous  ondansetron (ZOFRAN) 2 mg/mL injection, 4 mg, Intravenous, Q6H PRN  phenazopyridine (PYRIDIUM) tablet, 200 mg, Oral, 3x/day PRN  piperacillin-tazobactam (ZOSYN) 4.5 g in NS 100 mL IVPB, 4.5 g, Intravenous, Q8H        Allergies   Allergen Reactions   . No Known Allergies        Physical Exam:  General: appears stated age,  obese, no distress  Eyes: conjunctiva clear, pupils equal and round  HENT: head atraumatic and normocephalic, moist oral mucosa  Lungs: clear to auscultation bilaterally, no crackles or wheezes  Cardiovascular: regular rate and rhythm, no murmurs, no rubs, no gallops  Abdomen: soft, non-tender, non-distended, bowel sounds normal  Genitourinary: +foley catheter draining clear urine  Extremities: no cyanosis, no LE edema  Skin: skin warm and dry, no rashes  Neurologic: AOx3, no gross deficits    Labs:  I have reviewed all lab results.  Lab Results Today:    Results for orders placed or performed during the hospital encounter of 10/28/19 (from the past 24 hour(s))   URINALYSIS, MACROSCOPIC   Result Value Ref Range    SPECIFIC GRAVITY 1.024 1.005 - 1.030    GLUCOSE Negative Negative mg/dL    PROTEIN 30  (A) Negative mg/dL    BILIRUBIN Negative Negative mg/dL    UROBILINOGEN Negative Negative mg/dL    PH 5.0 5.0 - 8.0    BLOOD Negative Negative mg/dL    KETONES 5  (A) Negative mg/dL    NITRITE Positive (A) Negative    LEUKOCYTES Small (A) Negative WBCs/uL  APPEARANCE Cloudy (A) Clear    COLOR Normal (Yellow) Normal (Yellow)   URINALYSIS, MICROSCOPIC   Result Value Ref Range    WBCS 51.0 (H) <4.0 /hpf    RBCS 3.0 <6.0 /hpf    BACTERIA Many (A) Occasional or less /hpf    SQUAMOUS EPITHELIAL CELLS Occasional or less Occasional or less /lpf    MUCOUS Light Light /lpf   BASIC METABOLIC PANEL   Result Value Ref Range    SODIUM 140 136 - 145 mmol/L    POTASSIUM 4.6 3.5 - 5.1 mmol/L    CHLORIDE 105 96 - 111 mmol/L    CO2 TOTAL 23 22 - 32 mmol/L    ANION GAP 12 4 - 13 mmol/L    CALCIUM 10.0 8.5 - 10.2 mg/dL    GLUCOSE 122 65 - 139 mg/dL    BUN 15 8 - 25 mg/dL    CREATININE 1.18 0.62 - 1.27 mg/dL    BUN/CREA RATIO 13 6 - 22    ESTIMATED GFR >60 >60 mL/min/1.34m^2   CBC WITH DIFF   Result Value Ref Range    WBC 13.0 (H) 3.7 - 11.0 x10^3/uL    RBC 5.02 4.50 - 6.10 x10^6/uL    HGB 14.4 13.4 - 17.5 g/dL    HCT 44.0 38.9 - 52.0 %     MCV 87.6 78.0 - 100.0 fL    MCH 28.7 26.0 - 32.0 pg    MCHC 32.7 31.0 - 35.5 g/dL    RDW-CV 13.4 11.5 - 15.5 %    PLATELETS 371 150 - 400 x10^3/uL    MPV 10.2 8.7 - 12.5 fL    NEUTROPHIL % 75 %    LYMPHOCYTE % 12 %    MONOCYTE % 11 %    EOSINOPHIL % 1 %    BASOPHIL % 0 %    NEUTROPHIL # 9.92 (H) 1.50 - 7.70 x10^3/uL    LYMPHOCYTE # 1.51 1.00 - 4.80 x10^3/uL    MONOCYTE # 1.37 (H) 0.20 - 1.10 x10^3/uL    EOSINOPHIL # <0.10 <=0.50 x10^3/uL    BASOPHIL # <0.10 <=0.20 x10^3/uL    IMMATURE GRANULOCYTE % 1 0 - 1 %    IMMATURE GRANULOCYTE # <0.10 <0.10 x10^3/uL   CBC - AM ONCE   Result Value Ref Range    WBC 9.9 3.7 - 11.0 x10^3/uL    RBC 4.43 (L) 4.50 - 6.10 x10^6/uL    HGB 12.9 (L) 13.4 - 17.5 g/dL    HCT 39.2 38.9 - 52.0 %    MCV 88.5 78.0 - 100.0 fL    MCH 29.1 26.0 - 32.0 pg    MCHC 32.9 31.0 - 35.5 g/dL    RDW-CV 13.7 11.5 - 15.5 %    PLATELETS 345 150 - 400 x10^3/uL    MPV 9.9 8.7 - 12.5 fL   BASIC METABOLIC PANEL - AM ONCE   Result Value Ref Range    SODIUM 140 136 - 145 mmol/L    POTASSIUM 3.8 3.5 - 5.1 mmol/L    CHLORIDE 107 96 - 111 mmol/L    CO2 TOTAL 25 22 - 32 mmol/L    ANION GAP 8 4 - 13 mmol/L    CALCIUM 8.6 8.5 - 10.2 mg/dL    GLUCOSE 110 65 - 139 mg/dL    BUN 10 8 - 25 mg/dL    CREATININE 0.82 0.62 - 1.27 mg/dL    BUN/CREA RATIO 12 6 - 22    ESTIMATED GFR >60 >  90 mL/min/1.53m^2       Radiology:    I have reviewed imaging studies  Results for orders placed or performed during the hospital encounter of 10/28/19 (from the past 72 hour(s))   CT ABDOMEN PELVIS W IV CONTRAST     Status: None    Narrative    Edmundo Foote  Male, 59 years old.    CT ABDOMEN PELVIS W IV CONTRAST performed on 10/28/2019 3:30 PM.    REASON FOR EXAM:  urinary retention, +UA, back pain, Hx stones - c/f  obstruction    RADIATION DOSE: 1896.80 mGycm    CONTRAST: 100 ml's of Isovue 300    TECHNIQUE:    COMPARISON: July 28, 2013    FINDINGS: Lung bases are clear. No pleural effusion seen. Liver is mildly  fatty infiltrated. Gallbladder  appears unremarkable. Spleen and pancreas  appear normal.    Both right and left kidneys show mild hydronephrosis and hydroureter. There  is left nephrolithiasis without evidence for obstructing stone. No left  ureteral stone identified. Urinary bladder is very distended. No stones  seen within the urinary bladder. No specific evidence for pyelonephritis  seen on either side. There are some prostate calcifications. No definite  urethral stone identified.    There is no bowel obstruction identified. Appendix not identified. There is  uncomplicated diverticulosis.    Patient has numerous small and stable appearing lymph nodes along the aorta  and bilateral iliac vessels. These were present back in 2014. No specific  evidence for adenopathy identified.      Impression    Marked distention of the urinary bladder. Mild bilateral hydronephrosis and  hydroureter possibly due to reflux from a distended urinary bladder. No  evidence for obstructing stone. Nonobstructing left nephrolithiasis and  mild left perinephric fat stranding without overt evidence for  pyelonephritis.           PT/OT: No    Consults: urology    Assessment/ Plan:   Active Hospital Problems    Diagnosis   . 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 Nephrolithiasis, non-obstructive  - urology consulted on admission--plan for outpatient cystoscopy and cystolitholapaxy next week  - CT imaging with bladder outlet obstruction, non-obstructive L. Nephrolithiasis, mild bilateral hydronephrosis  - foley catheter placed, to be kept on discharge and until returning for urologic procedure  - Cr 1.18-->0.82, monitor UOP  - discontinue MIVF, diet resumed  - Tylenol, Norco 5-325 mg Q4H PRN available for pain    Sepsis secondary to Complicated UTI / Pyelonephritis, possible  - SIRS  resolved: tachycardia reduced, WBC 15.4k-->9.9k, remains afebrile inpatient  - CT imaging with L. peripheric stranding, no complicating features or abscess  - blood and urine cultures pending  - continue IV Zosyn Q8H for now  - once urine culture sensitivities available will plan to de-escalate to PO antibiotic for discharge; only reported allergy is Bactrim    Hypertension  - continue amlodipine 5 mg daily  - continue lisinopril 20 mg daily    Hyperlipidemia  - continue ASA 81 mg daily and Atorvastatin 20 mg daily    DVT/PE Prophylaxis: Enoxaparin  Disposition Planning: Home discharge      Orlene Erm, MD 10:38 10/29/2019  [place]  [phone]

## 2019-10-29 NOTE — Discharge Instructions (Signed)
If you have any questions after you are discharged, but before you see your Primary Care Physician you can contact the Hospitalist Service Coordinators at [phone] or [phone] Monday-Friday 8:00am to 4:30pm.  After hours call [phone] and ask to speak to the MD on-call.  If you are having a medical or mental health emergency call 911 or seek emergent care.

## 2019-10-29 NOTE — Care Plan (Signed)
Problem: Adult Inpatient Plan of Care  Goal: Plan of Care Review  Outcome: Ongoing (see interventions/notes)  Flowsheets (Taken 10/29/2019 0339)  Plan of Care Reviewed With: patient  Note: Plan of care reviewed. Pt AOx4 and cooperative with care this shift.  Pain managed with meds per MAR. Assessment per doc flow. Pt free from falls this shift. Needs met with hourly rounding. Pt resting at this time with call bell in reach. Wheels locked and bed in lowest position. Will continue to monitor.       Problem: Fall Injury Risk  Goal: Absence of Fall and Fall-Related Injury  Outcome: Ongoing (see interventions/notes)     Problem: Pain Acute  Goal: Acceptable Pain Control and Functional Ability  Outcome: Ongoing (see interventions/notes)

## 2019-10-29 NOTE — Transitional Care (Signed)
Hospital follow up appointment scheduled for 11/05/19 at   10:00 am with PCP .  Discharge date/time along with coordinators phone number added to after visit summary.

## 2019-10-29 NOTE — Care Plan (Signed)
[place]  Occupational Therapy Initial Evaluation    Patient Name: Jason Ball  Date of Birth:   Height: Height: 180.3 cm ('5\' 11"' )  Weight: Weight: 125 kg (276 lb 7.3 oz)  Room/Bed: 32/A  Payor: BLUE CROSS BLUE SHIELD / Plan: [place] PPO / Product Type: PPO /     Assessment:   Order received and chart reviewed. Patient tolerated Occupational Therapy Evaluation well. Jason Ball presents with Acute urinary retention secondary to posterior urethral stone, Complicated UTI, and Left renal small non-obstructing stone. Patient currently able to complete ADL tasks with modified independence and functional transfers/mobility with independence. Provided education on OT purpose and plan of care and safety with ADLs and functional transfers/mobility; patient verbalized understanding on all educational material. From OT perspective, once medically stable, patient is safe to d/c home with assistance as needed. There are no acute OT needs at this time; however, if patient would have change in status, please re-consult.      Discharge Needs:   Equipment Recommendation: none anticipated  Discharge Disposition: home with assist    Plan:   D/C OT order      The risks/benefits of therapy have been discussed with the patient/caregiver and he/she is in agreement with the established plan of care.       Subjective & Objective        10/29/19 0805   Therapist Pager   OT Assigned/ Pager # [phone]   Rehab Session   Document Type evaluation   Total OT Minutes: 14   Patient Effort good   Symptoms Noted During/After Treatment none   General Information   Patient Profile Reviewed yes   General Observations of Patient Patient agreeable to OT consult/evaluation. Patient seen with professional assist of PT.   Pertinent History of Current Functional Problem Patient presents with acute urinary retention secondary to posterior urethral stone, Complicated UTI, and Left renal small  non-obstructing stone.   Medical Lines Foley Catheter;PIV Line   Respiratory Status room air   Existing Precautions/Restrictions fall precautions;full code   Pre Treatment Status   Pre Treatment Patient Status Patient supine in bed;Call light within reach;Telephone within reach;Sitter select activated;Nurse approved session   Support Present Pre Treatment  None   Communication Pre Treatment  Nurse   Mutuality/Individual Preferences   Individualized Care Needs Up ad lib; promote independence with self-care tasks   Living Environment   Lives With spouse   Living Arrangements house  (2 level)   Home Assessment: Stairs in [place] Accessibility stairs to enter home;stairs within home;other (see comments)  (walk-in shower)   Home Main Entrance   Number of Stairs, Main Entrance four   Stairs Within Home, Primary   Stairs, Within Home, Primary 12   Functional Level Prior   Ambulation 0 - independent   Transferring 0 - independent   Toileting 0 - independent   Bathing 0 - independent   Dressing 0 - independent   Eating 0 - independent   Communication 0 - understands/communicates without difficulty   Swallowing 0-->swallows foods/liquids without difficulty   Self-Care   Equipment Currently Used at Home no   Vital Signs   O2 Delivery Pre Treatment room air   O2 Delivery Post Treatment room air   Pain Assessment   Additional Documentation Pain Scale: Numbers Pre/Post-Treatment (Group)   Pretreatment Pain Rating 0/10 - no pain   Posttreatment Pain Rating 0/10 - no pain   Coping/Psychosocial  Observed Emotional State calm;cooperative   Verbalized Emotional State acceptance   Coping/Psychosocial Response Interventions   Plan Of Care Reviewed With patient   Cognitive Assessment/Interventions   Behavior/Mood Observations alert;cooperative   Attention WNL/WFL   Follows Commands WFL   Vision Assessment/Interventions   Visual Impairment/Limitations WFL   RUE Assessment   RUE Assessment WFL- Within Functional Limits   LUE  Assessment   LUE Assessment WFL- Within Functional Limits   RLE Assessment   RLE Assessment WFL- Within Functional Limits   LLE Assessment   LLE Assessment WFL- Within Functional Limits   Trunk Assessment   Trunk Assessment WFL-Within Functional Limits   Mobility Assessment/Training   Mobility Comment Patient completed functional mobility of ~200 feet unsupported with independence.   Bed Mobility Assessment/Treatment   Supine-Sit Independence independent   Sit to Supine, Independence independent   Transfer Assessment/Treatment   Sit-Stand Independence independent   Stand-Sit Independence independent   Transfer Comment Patient transferred from sitting at EOB to standing and standing to sitting at EOB with independence.   Bathing Assessment/Training   Comment Patient reports no concerns with completing bathing tasks at home.   Lower Body Dressing Assessment/Training   Position sitting;standing   DRESSING ASSESSED Don Pants- pull up;Don Pants- fasten;Don Socks   Independence Level  modified independence   Comment Extra time due to foley placement   Toileting Assessment/Training   Comment Patient reports no concerns with completing toileting tasks.   Balance Skill Training   Sitting Balance: Static good balance   Sitting, Dynamic (Balance) good balance   Sit-to-Stand Balance good balance   Standing Balance: Static good balance   Standing Balance: Dynamic good balance   Post Treatment Status   Post Treatment Patient Status Patient supine in bed;Call light within reach;Telephone within reach;Sitter select activated   Support Present Post Treatment  None   Clinical Impression   Functional Level at Time of Session Order received and chart reviewed. Patient tolerated Occupational Therapy Evaluation well. Jason Ball presents with Acute urinary retention secondary to posterior urethral stone, Complicated UTI, and Left renal small non-obstructing stone. Patient currently able to complete ADL tasks with modified independence and  functional transfers/mobility with independence. Provided education on OT purpose and plan of care and safety with ADLs and functional transfers/mobility; patient verbalized understanding on all educational material. From OT perspective, once medically stable, patient is safe to d/c home with assistance as needed. There are no acute OT needs at this time; however, if patient would have change in status, please re-consult.   Criteria for Skilled Therapeutic Interventions Met (OT) no;no problems identified which require skilled intervention   Therapy Frequency Evaluation Only   Predicted Duration of Therapy evaluation only   Anticipated Equipment Needs at Discharge none anticipated   Anticipated Discharge Disposition home with assist   Evaluation Complexity Justification   Occupational Profile Review Expanded review   Performance Deficits None   Clinical Decision Making Low analytic complexity   Evaluation Complexity Low       Therapist:   Jeronimo Greaves, OTR/L   Pager #: [phone]

## 2019-10-29 NOTE — Nurses Notes (Signed)
Pt arrived to unit. Oriented to unit and surroundings. Will continue to monitor.

## 2019-10-29 NOTE — Consults (Signed)
Pre follow-up consult note    Jason Ball   [id number]  10/29/2019  08:08      Patient's leukocytosis improved and his white blood count is normal.  He was afebrile overnight.    Recommendations:  Continue antibiotic treatment for complicated UTI and adjust according to culture results   Patient will be scheduled for cystoscopy and cystolitholapaxy for removal of stone posterior urethra. That will be done after treatment of UTI (most likely next week)   Maintain Foley catheter upon discharge until his procedure with urology   No intervention indicated for left renal stone at this time     Carlena Hurl, MD  10/29/2019, 08:10        I saw and examined the patient.  I reviewed the resident's note.  I agree with the findings and plan of care as documented in the resident's note.  Any exceptions/additions are edited/noted.    Weldon Inches, MD

## 2019-10-29 NOTE — Care Plan (Signed)
Problem: Adult Inpatient Plan of Care  Goal: Plan of Care Review  Outcome: Ongoing (see interventions/notes)  Flowsheets (Taken 10/29/2019 1042)  Plan of Care Reviewed With: patient  Note: Discharge Plan:  Home (Patient/Family Member/other) (code 1)

## 2019-10-29 NOTE — Care Plan (Signed)
[place]  Physical Therapy Initial Evaluation    Patient Name: Jason Ball  Date of Birth:   Height: Height: 180.3 cm (5\' 11" )  Weight: Weight: 125 kg (276 lb 7.3 oz)  Room/Bed: 32/A  Payor: BLUE CROSS BLUE SHIELD / Plan: [place] PPO / Product Type: PPO /     Assessment:      59 yo admitted with a complicated UTI. Pt independent with functional mobility.OK to d/c home from PT perspective.    Discharge Needs:    Equipment Recommendation: none anticipated        Discharge Disposition: home    JUSTIFICATION OF DISCHARGE RECOMMENDATION   Based on current diagnosis, functional performance prior to admission, and current functional performance, this patient requires continued PT services in home in order to achieve significant functional improvements in these deficit areas:  .    Plan:   Current Intervention:  (d/c PT)  To provide physical therapy services    for duration of evaluation only.    The risks/benefits of therapy have been discussed with the patient/caregiver and he/she is in agreement with the established plan of care.       Subjective & Objective        10/29/19 [id number]   Therapist Pager   PT Assigned/ Pager # Jaidence Geisler [phone]   Rehab Session   Document Type evaluation   Total PT Minutes: 13   Patient Effort good   Symptoms Noted During/After Treatment none   General Information   Patient Profile Reviewed yes   Onset of Illness/Injury or Date of Surgery 10/28/19   Pertinent History of Current Functional Problem 59 yo male admitted with Acute Urinary Retention, L. Nephrolithiasis, non-obstructive, and sepsis secondary to complicated UTI   Medical Lines Foley Catheter;PIV Line   Respiratory Status room air   Existing Precautions/Restrictions fall precautions   Mutuality/Individual Preferences   Individualized Care Needs up ad lib   Living Environment   Lives With spouse   Living Arrangements house   Home Assessment: Stairs in [place] Accessibility  stairs to enter home;stairs within home   Stairs Within Home, Primary   Stairs, Within Home, Primary 12   Home Main Entrance   Number of Stairs, Main Entrance four   Functional Level Prior   Ambulation 0 - independent   Transferring 0 - independent   Toileting 0 - independent   Bathing 0 - independent   Dressing 0 - independent   Eating 0 - independent   Communication 0 - understands/communicates without difficulty   Self-Care   Equipment Currently Used at Home no   Pre Treatment Status   Pre Treatment Patient Status Patient supine in bed;Call light within reach   Support Present Pre Treatment  None   Communication Pre Treatment  Nurse   Cognitive Assessment/Interventions   Behavior/Mood Observations alert;cooperative   Attention WNL/WFL   Follows Commands WFL   Vital Signs   O2 Delivery Pre Treatment room air   O2 Delivery Post Treatment room air   Pain Assessment   Pretreatment Pain Rating 0/10 - no pain   Posttreatment Pain Rating 0/10 - no pain   RUE Assessment   RUE Assessment WFL- Within Functional Limits   LUE Assessment   LUE Assessment WFL- Within Functional Limits   RLE Assessment   RLE Assessment WFL- Within Functional Limits   LLE Assessment   LLE Assessment WFL- Within Functional Limits   Trunk Assessment   Trunk  Assessment WFL-Within Functional Limits   Bed Mobility Assessment/Treatment   Supine-Sit Independence independent   Sit to Supine, Independence independent   Transfer Assessment/Treatment   Sit-Stand Independence independent   Stand-Sit Independence independent   Gait Assessment/Treatment   Independence  independent   Distance in Feet 200   Balance Skill Training   Sitting Balance: Static good balance   Sitting, Dynamic (Balance) good balance   Sit-to-Stand Balance good balance   Standing Balance: Static good balance   Standing Balance: Dynamic good balance   Post Treatment Status   Post Treatment Patient Status Patient supine in bed;Call light within reach   Support Present Post Treatment   None   Plan of Care Review   Plan Of Care Reviewed With patient   Physical Therapy Clinical Impression   Assessment 59 yo admitted with a complicated UTI. Pt independent with functional mobility.OK to d/c home from PT perspective.   Criteria for Skilled Therapeutic no   Predicted Duration of Therapy Intervention (days/wks) evaluation only   Anticipated Equipment Needs at Discharge (PT) none anticipated   Anticipated Discharge Disposition home   Evaluation Complexity Justification   Patient History: Co-morbidity/factors that impact Plan of Care 0 none that impact Plan of Care   Examination Components 4 or more Exam elements addressed   Presentation Stable: Uncomplicated, straight-forward, problem focused   Clinical Decision Making Low complexity   Evaluation Complexity Low complexity   Planned Therapy Interventions, PT Eval   Planned Therapy Interventions (PT)   (d/c PT)       Therapist:   Moshe Salisbury, PT   Pager #: [phone]

## 2019-10-30 DIAGNOSIS — N200 Calculus of kidney: Secondary | ICD-10-CM

## 2019-10-30 LAB — CBC WITH DIFF
BASOPHIL #: <0.1 10*3/uL (ref <=0.20)
BASOPHIL %: 1 %
EOSINOPHIL #: 0.3 10*3/uL (ref <=0.50)
EOSINOPHIL %: 4 %
HCT: 38.4 % — ABNORMAL LOW (ref 38.9–52.0)
HGB: 12.5 g/dL — ABNORMAL LOW (ref 13.4–17.5)
IMMATURE GRANULOCYTE #: <0.1 10*3/uL (ref <0.10)
IMMATURE GRANULOCYTE %: 0 % (ref 0–1)
LYMPHOCYTE #: 2.36 10*3/uL (ref 1.00–4.80)
LYMPHOCYTE %: 35 %
MCH: 28.5 pg (ref 26.0–32.0)
MCHC: 32.6 g/dL (ref 31.0–35.5)
MCV: 87.7 fL (ref 78.0–100.0)
MONOCYTE #: 0.99 10*3/uL (ref 0.20–1.10)
MONOCYTE %: 15 %
MPV: 9 fL (ref 8.7–12.5)
NEUTROPHIL #: 3.12 10*3/uL (ref 1.50–7.70)
NEUTROPHIL %: 45 %
PLATELETS: 344 10*3/uL (ref 150–400)
RBC: 4.38 10*6/uL — ABNORMAL LOW (ref 4.50–6.10)
RDW-CV: 13.5 % (ref 11.5–15.5)
WBC: 6.9 10*3/uL (ref 3.7–11.0)

## 2019-10-30 LAB — URINE CULTURE,ROUTINE: URINE CULTURE: 100000 — AB

## 2019-10-30 LAB — BASIC METABOLIC PANEL
ANION GAP: 8 mmol/L (ref 4–13)
BUN/CREA RATIO: 12 (ref 6–22)
BUN: 10 mg/dL (ref 8–25)
CALCIUM: 8.9 mg/dL (ref 8.5–10.2)
CHLORIDE: 107 mmol/L (ref 96–111)
CO2 TOTAL: 24 mmol/L (ref 22–32)
CREATININE: 0.84 mg/dL (ref 0.62–1.27)
ESTIMATED GFR: >60 mL/min/{1.73_m2} (ref >60)
GLUCOSE: 141 mg/dL — ABNORMAL HIGH (ref 65–139)
POTASSIUM: 3.7 mmol/L (ref 3.5–5.1)
SODIUM: 139 mmol/L (ref 136–145)

## 2019-10-30 LAB — MAGNESIUM: MAGNESIUM: 1.8 mg/dL (ref 1.6–2.6)

## 2019-10-30 MED ORDER — HYDROCODONE 5 MG-ACETAMINOPHEN 325 MG TABLET
1.00 | ORAL_TABLET | Freq: Three times a day (TID) | ORAL | 0 refills | Status: DC | PRN
Start: 2019-10-30 — End: 2021-11-22

## 2019-10-30 MED ORDER — CIPROFLOXACIN 500 MG TABLET
500.00 mg | ORAL_TABLET | Freq: Two times a day (BID) | ORAL | 0 refills | Status: AC
Start: 2019-10-30 — End: 2019-11-06

## 2019-10-30 NOTE — Discharge Summary (Signed)
[facility]  DISCHARGE SUMMARY    PATIENT NAME:  Jason Ball, Jason Ball  MRN:  [id number]  DOB:      ENCOUNTER DATE:  10/28/2019  INPATIENT ADMISSION DATE: 10/28/2019  DISCHARGE DATE:  10/30/2019    ATTENDING PHYSICIAN: Glorious Peach, MD   SERVICE: HOSPITALIST 5  PRIMARY CARE PHYSICIAN: Milinda Antis, DO     Has PCP been verified with patient and updated? Yes      PRIMARY DISCHARGE DIAGNOSIS:    Active Hospital Problems    Diagnosis Date Noted   . Pyelonephritis [N12] 10/28/2019      Resolved Hospital Problems   No resolved problems to display.     Active Non-Hospital Problems    Diagnosis Date Noted   . Hyperlipidemia 02/02/2017   . Lymphoma (CMS [facility]) 11/06/2013   . Asthma    . Hypertension    . Palpitations    . Obesity    . Sinusitis, chronic         DISCHARGE MEDICATIONS:     Current Discharge Medication List      START taking these medications.      Details   ciprofloxacin HCl 500 mg Tablet  Commonly known as: CIPRO   500 mg, Oral, 2 TIMES DAILY  Qty: 14 Tablet  Refills: 0     HYDROcodone-acetaminophen 5-325 mg Tablet  Commonly known as: NORCO   1 Tablet, Oral, EVERY 8 HOURS PRN  Qty: 8 Tablet  Refills: 0        CONTINUE these medications - NO CHANGES were made during your visit.      Details   Amlodipine-Benazepril 10-40 mg Capsule   Take 1 capsule by mouth once daily  Qty: 30 Cap  Refills: 5     aspirin 81 mg Tablet, Delayed Release (E.C.)  Commonly known as: ECOTRIN   81 mg, DAILY  Refills: 0     atorvastatin 20 mg Tablet  Commonly known as: LIPITOR   20 mg, Oral, DAILY  Qty: 90 Tab  Refills: 3     docusate sodium 100 mg Capsule  Commonly known as: COLACE   100 mg, Oral, 2 TIMES DAILY  Refills: 0     phenazopyridine 200 mg Tablet  Commonly known as: PYRIDIUM   200 mg, Oral, 3 TIMES DAILY  Qty: 12 Each  Refills: 0        STOP taking these medications.    naproxen 500 mg Tablet  Commonly known as: NAPROSYN            Discharge med list refreshed?  YES     During this hospitalization did the patient have an  AMI, PCI/PCTA, STENT or Isolated CABG?  No                ALLERGIES:  Allergies   Allergen Reactions   . Bactrim [Sulfamethoxazole-Trimethoprim]    . No Known Allergies        HOSPITAL PROCEDURE(S):   Bedside Procedures:  No orders of the defined types were placed in this encounter.    Surgical     REASON FOR HOSPITALIZATION AND HOSPITAL COURSE     BRIEF HPI/BRIEF HOSPITAL NARRATIVE:      Patient is a 48 YOM  W/ PMH of  [place]HL who p/w symptoms of pyelonephritis, reduced urine output, suprapubic pressure. Found to have complicated UTI and severe urinary retention. All questions were answered. Patient in agreement with plan of disposition. Patient instructed to return if symptoms reoccur. See below for  further details.       Acute Urinary Retention & Left nephrolithiasis, non-obstructing:    Urology plan for OP cystoscopy and cystolitholapaxy at F/U   CT bladder outlet obstruction, non-ob L-stone BL hydro  Foley placed to kept at discharge   Follow-up PCP      Sepsis secondary to Complicated UTI/Pyelonephritis, Klebsiella:    Sepsis has resolved   CT a/p  L-peripheric stranding  Blood cultures 2/8 no growth x 48 hours   Urine cultures Klebsiella   Continue Zosyn for now for total course of 10 days  Allergic to Bactrim    Continue Ciprofloxacin at discharge     Hypertension:    Continue home regimen at discharge     Hyperlipidemia    Continue ASA 81 mg daily  Continue Atorvastatin 20 mg daily      TRANSITION/POST DISCHARGE CARE/PENDING TESTS/REFERRALS:     Follow-up PCP   Follow-up Urology       CONDITION ON DISCHARGE:  A. Ambulation: Full ambulation  B. Self-care Ability: Complete  C. Cognitive Status Alert and Oriented x 3  D. Code status at discharge:   Code Status Information     Code Status    Full Code             LINES/DRAINS/WOUNDS AT DISCHARGE:   Patient Lines/Drains/Airways Status    Active Line / Dialysis Catheter / Dialysis Graft / Drain / Airway / Wound     Name: Placement date: Placement time:  Site: Days:    Peripheral IV Left Median Cubital  (antecubital fossa)  10/28/19   1300   1    Foley Catheter  10/28/19   1638   1                DISCHARGE DISPOSITION:  Home discharge      DISCHARGE INSTRUCTIONS:  Follow-up Information     Milinda Antis, DO. Go on 11/05/2019.    Specialty: FAMILY MEDICINE  Why: Hospital discharge follow up at 10:00 am  Contact information:  [place] [address]  [phone]                 No discharge procedures on file.       Glorious Peach, MD    Copies sent to Care Team       Relationship Specialty Notifications Start End    Milinda Antis, DO PCP - General EXTERNAL  08/07/13     Phone: [phone] Fax: [phone]         [address] [place] [address]    Harmon Dun, MD PCP - Oncology -  Primary Hematology and Oncology Admissions 11/06/13     Phone: [phone] Fax: [phone]         [place] [address]    Cydney Ok Registered Nurse Hematology and Oncology Admissions 11/06/13           Referring providers can utilize [email or web address] to access their referred [place] [facility] [place] patient's information.

## 2019-10-30 NOTE — Care Plan (Signed)
Problem: Adult Inpatient Plan of Care  Goal: Absence of Hospital-Acquired Illness or Injury  Outcome: Ongoing (see interventions/notes)     Problem: Adult Inpatient Plan of Care  Goal: Optimal Comfort and Wellbeing  Outcome: Ongoing (see interventions/notes)     Problem: Fall Injury Risk  Goal: Absence of Fall and Fall-Related Injury  Outcome: Ongoing (see interventions/notes)     Problem: Pain Acute  Goal: Acceptable Pain Control and Functional Ability  Outcome: Ongoing (see interventions/notes)   Patient is alert and oriented; skin warm and dry; able to ambulate to BR with no assistance; denies pain; continues to receive IV antibiotics;constipation resolved; had BM on 020921 and 021021;foley draining clear yellow urine; for possible discharge today

## 2019-10-30 NOTE — Progress Notes (Signed)
Hospitalist Progress Note    Jason Ball       59 y.o.       Date of service: 10/30/2019  Date of Admission:  10/28/2019    Hospital Day:  LOS: 2 days       Problem List:       Active Hospital Problems    Diagnosis   . Pyelonephritis         Assessment:       Patient is a 44 YOM  W/ PMH of  [place]HL who p/w symptoms of pyelonephritis, reduced urine output, suprapubic pressure. Found to have complicated UTI and severe urinary retention.       Plan:       1. Acute Urinary Retention & Left nephrolithiasis, non-obstructing:    Urology plan for OP cystoscopy and cystolitholapaxy at F/U   CT bladder outlet obstruction, non-ob L-stone BL hydro  Foley placed to kept at discharge      2. Sepsis secondary to Complicated UTI/Pyelonephritis, Klebsiella:    Sepsis has resolved   CT a/p  L-peripheric stranding  Blood cultures 2/8 no growth x 24 hours   Urine cultures Klebsiella sensitive to ciprofloxacin    Continue Zosyn for now for total course of 10 days  Allergic to Bactrim      3. Hypertension:    Continue amlodipine 5 mg daily  Continue lisinopril 20 mg daily    4. Hyperlipidemia    Continue ASA 81 mg daily  Continue Atorvastatin 20 mg daily        DVT/PE Prophylaxis: Enoxaparin      Disposition Planning: Home discharge          CC: Groin pain      Subjective: Patient denies chest pain, SOB, abdominal pain, fever, chills, nausea or vomiting.     No dysuria present     Foley with good output         Filed Vitals:    10/29/19 1518 10/29/19 1935 10/29/19 2302 10/30/19 0258   BP: 128/79 133/80 (!) 142/83 134/85   Pulse: 87 93 83 82   Resp: 18 18 18 18    Temp: 36.9 C (98.4 F) 36.6 C (97.9 F) 36.8 C (98.2 F) 36.8 C (98.2 F)   SpO2: 95% 94%  93%     I have personally reviewed the vitals.      Physical Exam:    General: obesity, NAD   HEENT: NCAT, mucus membranes moist    Pulm: CTA bilaterally, no wheeze or rhonchi evident  Card: RRR, no murmur  GI: soft, non-tender, non-distended  Urology: Foley in place    MSK: no edema,  normal tone    Skin: no rash evident, warm   Neurologic: grossly intact, no focal findings     Labs: I personally reviewed all labs.     URINE CULTURE 65000 Klebsiella oxytocaAbnormal           Additional organism(s) present.             Imaging: I personally reviewed all imaging.       CT abdomen and pelvis 2/8:    Marked distention of the urinary bladder. Mild bilateral hydronephrosis and  hydroureter possibly due to reflux from a distended urinary bladder. No  evidence for obstructing stone. Nonobstructing left nephrolithiasis and  mild left perinephric fat stranding without overt evidence for  pyelonephritis.      > 30 minutes for inpatient discharge       Glorious Peach,  MD  Internal Medicine   Assistant Professor   [phone]

## 2019-10-30 NOTE — Nurses Notes (Signed)
Patient discharged home with family.  AVS reviewed with patient/care giver.  A written copy of the AVS and discharge instructions was given to the patient/care giver.  Questions sufficiently answered as needed.  Patient/care giver encouraged to follow up with PCP as indicated.  In the event of an emergency, patient/care giver instructed to call 911 or go to the nearest emergency room. IV intact on removal. Pt educated on foley for discharge.

## 2019-10-31 ENCOUNTER — Telehealth (HOSPITAL_COMMUNITY): Payer: Self-pay

## 2019-10-31 LAB — URINE CULTURE: URINE CULTURE: 65000 — AB

## 2019-10-31 NOTE — Telephone Encounter (Signed)
Transition of Care Contact  Discharge Date: 10/30/2019  Transition Facility Type--Hospital (Inpatient or Observation)  [facility]  Interactive Contact(s): Completed or attempted contact indicated by Date/Time  Completed Contact  First Attempt--10/31/2019  4:02 PM  Second Attempt--10/31/2019  4:03 PM  Third Attempt--10/31/2019  4:03 PM  Contact Method(s)-- Patient/Caregiver Telephone, MyChart Patient Portal  Clinical Staff Name/Role who contacted--Jossilyn Benda Elicia Lamp, LPN Hospitalist Coordinator  Transition Note:      Further information may be documented in relevant telephone or outreach encounter.

## 2019-11-02 LAB — ADULT ROUTINE BLOOD CULTURE, SET OF 2 BOTTLES (BACTERIA AND YEAST)
BLOOD CULTURE, ROUTINE: NO GROWTH
BLOOD CULTURE, ROUTINE: NO GROWTH

## 2019-11-04 NOTE — Nurses Notes (Signed)
Pt called in for clarification of his discharge instructions.  Instructions for DC followup appt is tomorrow.

## 2019-11-05 ENCOUNTER — Ambulatory Visit (INDEPENDENT_AMBULATORY_CARE_PROVIDER_SITE_OTHER): Payer: BC Managed Care – PPO | Admitting: Family Medicine

## 2019-11-05 ENCOUNTER — Inpatient Hospital Stay (INDEPENDENT_AMBULATORY_CARE_PROVIDER_SITE_OTHER): Payer: Self-pay | Admitting: Family Medicine

## 2019-11-05 ENCOUNTER — Other Ambulatory Visit: Payer: Self-pay

## 2019-11-05 ENCOUNTER — Encounter (INDEPENDENT_AMBULATORY_CARE_PROVIDER_SITE_OTHER): Payer: Self-pay | Admitting: Family Medicine

## 2019-11-05 VITALS — BP 146/80 | HR 97 | Temp 96.9°F | Resp 17 | Ht 71.0 in | Wt 283.0 lb

## 2019-11-05 DIAGNOSIS — N12 Tubulo-interstitial nephritis, not specified as acute or chronic: Secondary | ICD-10-CM

## 2019-11-05 DIAGNOSIS — R339 Retention of urine, unspecified: Secondary | ICD-10-CM

## 2019-11-05 NOTE — Progress Notes (Signed)
[facility]  [address] [place] [address]  Dept Phone: [phone]  Dept Fax: [phone]    NAME: Jason Ball  DOB:    AGE:  59 y.o.  MRN:  [id number]  APPT:  11/05/2019  9:30 AM EST    SUBJECTIVE:  Jason Ball is a 59 y.o. male is here today for hospital discharge follow up. He was admitted from  10/28/19 to  10/30/2019  for Pyelonephritis with [facility] .     Patient admitted with abdominal pain and urinary retention. Found to have possible pyelonephritis. Was placed on abx and has foley catheter. Feeling better and back to himself. Finished his Cipro. Still with foley catheter. Plan was to get cystoscopy as outpatietn for further evaluation.     Labs and scans reviewed.         Transition of Care Contact  Discharge Date: 10/30/2019  Transition Facility Type--Hospital (Inpatient or Observation)  [facility]  Interactive Contact(s): Completed or attempted contact indicated by Date/Time  Completed Contact  First Attempt--10/31/2019  4:02 PM  Second Attempt--10/31/2019  4:03 PM  Third Attempt--10/31/2019  4:03 PM  Contact Method(s)-- Patient/Caregiver Telephone, MyChart Patient Portal  Clinical Staff Name/Role who contacted--Cynthia Elicia Lamp, LPN Hospitalist Coordinator  Transition Note:      Further information may be documented in relevant telephone or outreach encounter.        Face to face completed on  11/05/2019    Results from the hospital visit have been reviewed .       I have reviewed the patient's medical, surgical, family and social history in detail today, 11/05/2019  and updated the computerized patient record as appropriate.  I reviewed the discharge summary from the recent hospital stay.    Current Outpatient Medications   Medication Sig   . Amlodipine-Benazepril 10-40 mg Oral Capsule Take 1 capsule by mouth once daily   . aspirin (ECOTRIN) 81 mg Oral Tablet, Delayed Release (E.C.) Take 81 mg by mouth Once a day      . atorvastatin (LIPITOR) 20 mg Oral Tablet Take 1 Tab (20 mg total) by mouth Once a day   . ciprofloxacin HCl (CIPRO) 500 mg Oral Tablet Take 1 Tablet (500 mg total) by mouth Twice daily for 7 days   . docusate sodium (COLACE) 100 mg Oral Capsule Take 100 mg by mouth Twice daily   . HYDROcodone-acetaminophen (NORCO) 5-325 mg Oral Tablet Take 1 Tablet by mouth Every 8 hours as needed for up to 8 doses   . phenazopyridine (PYRIDIUM) 200 mg Oral Tablet Take 1 Tablet (200 mg total) by mouth Three times a day       Discharge medications were reviewed with the patient and the current medication list has been reconciled.      REVIEW OF SYSTEMS:  General: (-) pain. (-) fevers (-) chills. (-) weight loss. (-) fatigue.  Lymphatic: (-) palpable masses. (-) night sweats.  Heme: (-) easy bruising (-) bleeding. (-) recurrent infections.   HEENT. (-) vision changes (-) hearing changes. (-) dysphagia. (-) sore throat.   Heart: (-) chest pain. (-) palpitation. (-) orthopnea. (-) LE edema.   Lungs: (-) dyspnea (on exertion) (-) hemoptysis. (-) cough.   Abdomen: (-) poor appetite. (-) abdominal pain. (-) nausea (-) vomiting. (-) diarrhea. (-) constipation.   GU: (-) dysuria (-) Urgency. (-) Hematuria.   MS. (-) joint pain (-) ext swelling. (-) Back pain.   Dermatologic: (-)  rashes. (-) pruritus.   Psychiatric: (-) Depression. (-) anxiety. (-) insomnia.   Neurologic: (-) headaches. (-) neuropathy. (-) weakness. (-) memory problems.      OBJECTIVE:   Vitals: BP (!) 146/80   Pulse 97   Temp 36.1 C (96.9 F) (Thermal Scan)   Resp 17   Ht 1.803 m (5\' 11" )   Wt 128 kg (283 lb)   SpO2 96%   BMI 39.47 kg/m      Body mass index is 39.47 kg/m.  Wt Readings from Last 3 Encounters:   11/05/19 128 kg (283 lb)   10/28/19 125 kg (276 lb 7.3 oz)   10/28/19 128 kg (283 lb)     BP Readings from Last 3 Encounters:   11/05/19 (!) 146/80   10/30/19 (!) 147/82   10/28/19 (!) 158/94         General appearance: alert, cooperative, no distress,  appears stated age   Eyes: pink conjunctivae, anicteric sclerae, normal EOM  Neck: No thyromegaly, No carotid bruit, supple neck  Lungs: clear to auscultation bilaterally   Heart: regular rate and rhythm, S1, S2 normal, no murmur  Abdomen: soft, non-tender. Bowel sounds normal.  Extremities: extremities normal, atraumatic, no cyanosis or edema   Psychiatric: Normal affect and mood, No SI, NoHI        ASSESSMENT and PLAN:        ICD-10-CM    1. Urinary retention  R33.9 Refer to [facility]   2. Pyelonephritis  N12 Refer to [facility]       ENCOUNTER DIAGNOSES     ICD-10-CM   1. Urinary retention  R33.9   2. Pyelonephritis  N12       Finished abx for klebsiella pyelo. Cause of retention? Will refer to [facility] urology to get foley out and get further work up. No other changes at present.           The patient was given the opportunity to ask questions and those questions were answered to the patient's satisfaction. The patient was encouraged to call with any additional questions or concerns. Instructed patient to call back if symptoms worse.   Discussed with patient effects and side effects of medications. Medication safety was discussed. A copy of the patient's medication list was printed and given to the patient. A good faith effort was made to reconcile the patient's medications.   Medication reconciliation was performed and medication education was provided.  Education and care coordination was provided.    Education  infection control    Return in about 3 months (around 02/02/2020).  Advised to call back directly if there are further questions, or if these symptoms fail to improve as anticipated or worsen.    Milinda Antis, DO  [place], [place] [address]  Dept: [phone]  Dept Fax: [phone]

## 2019-11-07 ENCOUNTER — Inpatient Hospital Stay (HOSPITAL_COMMUNITY)
Admission: RE | Admit: 2019-11-07 | Discharge: 2019-11-07 | Disposition: A | Discharge status: Home / Self Care | Payer: BC Managed Care – PPO | Source: Ambulatory Visit

## 2019-11-07 ENCOUNTER — Encounter (HOSPITAL_COMMUNITY): Payer: Self-pay

## 2019-11-07 ENCOUNTER — Other Ambulatory Visit: Payer: Self-pay

## 2019-11-07 HISTORY — DX: Personal history of urinary (tract) infections: Z87.440

## 2019-11-13 ENCOUNTER — Encounter (HOSPITAL_COMMUNITY): Payer: Self-pay | Admitting: Urology

## 2019-11-13 ENCOUNTER — Ambulatory Visit (HOSPITAL_COMMUNITY): Payer: BC Managed Care – PPO | Admitting: Urology

## 2019-11-13 ENCOUNTER — Encounter (HOSPITAL_COMMUNITY)
Admission: RE | Disposition: A | Discharge status: Home / Self Care | Payer: Self-pay | Source: Ambulatory Visit | Attending: Urology

## 2019-11-13 ENCOUNTER — Ambulatory Visit (HOSPITAL_BASED_OUTPATIENT_CLINIC_OR_DEPARTMENT_OTHER): Payer: BC Managed Care – PPO | Admitting: Physician Assistant

## 2019-11-13 ENCOUNTER — Inpatient Hospital Stay
Admission: RE | Admit: 2019-11-13 | Discharge: 2019-11-13 | Disposition: A | Discharge status: Home / Self Care | Payer: BC Managed Care – PPO | Source: Ambulatory Visit | Attending: Urology | Admitting: Urology

## 2019-11-13 ENCOUNTER — Ambulatory Visit (HOSPITAL_COMMUNITY): Payer: BC Managed Care – PPO | Admitting: Physician Assistant

## 2019-11-13 ENCOUNTER — Other Ambulatory Visit: Payer: Self-pay

## 2019-11-13 DIAGNOSIS — N400 Enlarged prostate without lower urinary tract symptoms: Secondary | ICD-10-CM

## 2019-11-13 DIAGNOSIS — Z9889 Other specified postprocedural states: Secondary | ICD-10-CM

## 2019-11-13 DIAGNOSIS — N211 Calculus in urethra: Secondary | ICD-10-CM

## 2019-11-13 DIAGNOSIS — Z87442 Personal history of urinary calculi: Secondary | ICD-10-CM | POA: Insufficient documentation

## 2019-11-13 DIAGNOSIS — R338 Other retention of urine: Secondary | ICD-10-CM | POA: Insufficient documentation

## 2019-11-13 DIAGNOSIS — N401 Enlarged prostate with lower urinary tract symptoms: Secondary | ICD-10-CM | POA: Insufficient documentation

## 2019-11-13 DIAGNOSIS — Z960 Presence of urogenital implants: Secondary | ICD-10-CM

## 2019-11-13 SURGERY — CYSTOSCOPY
Anesthesia: General | Site: Bladder | Wound class: Clean Contaminated Wounds-The respiratory, GI, Genital, or urinary

## 2019-11-13 MED ORDER — DEXAMETHASONE SODIUM PHOSPHATE 4 MG/ML INJECTION SOLUTION
Freq: Once | INTRAMUSCULAR | Status: DC | PRN
Start: 2019-11-13 — End: 2019-11-13
  Administered 2019-11-13: 4 mg via INTRAVENOUS

## 2019-11-13 MED ORDER — LACTATED RINGERS INTRAVENOUS SOLUTION
INTRAVENOUS | Status: DC
Start: 2019-11-13 — End: 2019-11-13

## 2019-11-13 MED ORDER — SODIUM CHLORIDE 0.9 % (FLUSH) INJECTION SYRINGE
2.0000 mL | INJECTION | INTRAMUSCULAR | Status: DC | PRN
Start: 2019-11-13 — End: 2019-11-13

## 2019-11-13 MED ORDER — LIDOCAINE 2 % MUCOSAL JELLY IN APPLICATOR
10.0000 mL | Freq: Once | Status: DC | PRN
Start: 2019-11-13 — End: 2019-11-13
  Filled 2019-11-13: qty 10

## 2019-11-13 MED ORDER — DEXTROSE 5 % IN WATER (D5W) INTRAVENOUS SOLUTION
3.0000 g | Freq: Once | INTRAVENOUS | Status: AC
Start: 2019-11-13 — End: 2019-11-13
  Administered 2019-11-13: 2 g via INTRAVENOUS
  Filled 2019-11-13: qty 30

## 2019-11-13 MED ORDER — TAMSULOSIN 0.4 MG CAPSULE
0.40 mg | ORAL_CAPSULE | Freq: Every evening | ORAL | 3 refills | Status: DC
Start: 2019-11-13 — End: 2021-11-22

## 2019-11-13 MED ORDER — HYDROMORPHONE 2 MG/ML INJECTION SYRINGE
0.4000 mg | INJECTION | INTRAMUSCULAR | Status: DC | PRN
Start: 2019-11-13 — End: 2019-11-13

## 2019-11-13 MED ORDER — SODIUM CHLORIDE 0.9 % (FLUSH) INJECTION SYRINGE
2.0000 mL | INJECTION | Freq: Three times a day (TID) | INTRAMUSCULAR | Status: DC
Start: 2019-11-13 — End: 2019-11-13

## 2019-11-13 MED ORDER — LIDOCAINE (PF) 100 MG/5 ML (2 %) INTRAVENOUS SYRINGE
INJECTION | Freq: Once | INTRAVENOUS | Status: DC | PRN
Start: 2019-11-13 — End: 2019-11-13
  Administered 2019-11-13: 100 mg via INTRAVENOUS

## 2019-11-13 MED ORDER — MIDAZOLAM 1 MG/ML INJECTION SOLUTION
Freq: Once | INTRAMUSCULAR | Status: DC | PRN
Start: 2019-11-13 — End: 2019-11-13
  Administered 2019-11-13: 13:00:00 2 mg via INTRAVENOUS

## 2019-11-13 MED ORDER — FENTANYL (PF) 50 MCG/ML INJECTION SOLUTION
Freq: Once | INTRAMUSCULAR | Status: DC | PRN
Start: 2019-11-13 — End: 2019-11-13
  Administered 2019-11-13 (×2): 50 ug via INTRAVENOUS

## 2019-11-13 MED ORDER — PROPOFOL 10 MG/ML IV BOLUS
INJECTION | Freq: Once | INTRAVENOUS | Status: DC | PRN
Start: 2019-11-13 — End: 2019-11-13
  Administered 2019-11-13: 200 mg via INTRAVENOUS

## 2019-11-13 MED ORDER — ONDANSETRON HCL (PF) 4 MG/2 ML INJECTION SOLUTION
Freq: Once | INTRAMUSCULAR | Status: DC | PRN
Start: 2019-11-13 — End: 2019-11-13
  Administered 2019-11-13: 4 mg via INTRAVENOUS

## 2019-11-13 MED ORDER — HYDROMORPHONE 2 MG/ML INJECTION SYRINGE
0.2000 mg | INJECTION | INTRAMUSCULAR | Status: DC | PRN
Start: 2019-11-13 — End: 2019-11-13

## 2019-11-13 SURGICAL SUPPLY — 27 items
BAG DRAIN NONST LF  VNYL ACT 8MM DISP GE/OEC SYS (UROLOGICAL SUPPLIES) ×2 IMPLANT
BASKET STONE RTRVL 120CM 12MM 1.9FR 0TP URET NITINOL 4WR FLAT DIST SURF KNT TPLS STRL LF  DISP (UROLOGICAL SUPPLIES) IMPLANT
CATH URET FLXTP 5FR 70CM STD OP-EN LF  ACPT .038IN GW RETRO PYLGR (UROLOGICAL SUPPLIES) IMPLANT
CATH URET FLXTP 5FR 70CM STD O_P-EN LF ACPT .038IN GW RTRGD (UROLOGICAL SUPPLIES)
CONV USE ITEM 322843 - MAT OR SEAFOAM GRN 40X28IN SURGISAFE BRR BCK SLIP FREE (FLOR) ×2 IMPLANT
CONV USE ITEM 338593 - PACK SURG CYSTO PREP STRL DISP LTX (TRAY) ×2 IMPLANT
DEVICE SECURE STATLK FOLEY ANC H PAD STAB TRCT SIL CATH ADLT (UROLOGICAL SUPPLIES)
DEVICE SECURE STATLK FOLEY ANCH PAD STAB TRCT SIL CATH ADULT STRL LF  DISP (UROLOGICAL SUPPLIES) IMPLANT
DISC USE ITEM 307130 - GOWN SURG XL XLNG AAMI L4 IMPR_V BRTHBL HKLP CLSR STRL LF (PROTECTIVE PRODUCTS/GARMENTS) ×1
DISCONTINUED USE 127346 - FORCEPS STONE RTRVL 120CM 8MM_2.6FR GRSPT NITINOL PLMD PTFE (ENDOSCOPIC SUPPLIES) IMPLANT
DISCONTINUED USE ITEM 307130 - GOWN SURG XL XLNG AAMI L4 IMPR_V BRTHBL HKLP CLSR STRL LF (PROTECTIVE PRODUCTS/GARMENTS) ×2
DISCONTINUED USE ITEM 342873 - GOWN SURG XL XLNG AAMI L4 IMPR_V BRTHBL HKLP CLSR STRL LF (PROTECTIVE PRODUCTS/GARMENTS) ×2 IMPLANT
GARMENT COMPRESS MED CALF CENT AURA NYL VASOGRAD LTWT BRTHBL (ORTHOPEDICS (NOT IMPLANTS)) ×1
GARMENT COMPRESS MED CALF CENTAURA NYL VASOGRAD LTWT BRTHBL SEQ FIL BLU 18- IN (ORTHOPEDICS (NOT IMPLANTS)) ×2 IMPLANT
GOWN SURG XL L3 NONREINFORCE HKLP CLSR STRL LTX PNK SMS 47IN (DGOW) IMPLANT
GOWN SURG XL XLNG AAMI L4 IMPR V BRTHBL HKLP CLSR STRL LF (PROTECTIVE PRODUCTS/GARMENTS) ×1
GW URO .035IN 150CM 3CM SENSOR DUALFLX 10CM RADOPQ KINK RST (UROLOGICAL SUPPLIES)
GW URO .035IN 150CM 3CM SENSOR STR FLX TP RADOPQ NITINOL SS HDRPH PTFE URET LF (UROLOGICAL SUPPLIES) IMPLANT
PACK SURG CYSTO PREP STRL DISP LTX (TRAY) ×1
PAD ARMBOARD FOAM BLU FP-ECARM (POSITIONING PRODUCTS)
PAD ARMBOARD FOAM BLU_FP-ECARM (POSITIONING PRODUCTS)
PAD ARMBRD BLU (POSITIONING PRODUCTS) IMPLANT
TRAY CYSTO PREP PACK CS/10 (TRAY) ×1
WATER STRL 1000ML PLASTIC PR B TL LF (SOLUTIONS) ×1
WATER STRL 1000ML PLASTIC PR BTL LF (SOLUTIONS) ×2 IMPLANT
WATER STRL 2000ML PLASTIC CONTAINR UROMATIC LF (SOLUTIONS) IMPLANT
WATER STRL 2000ML PLASTIC CONT_AINR UROMATIC LF (SOLUTIONS)

## 2019-11-13 NOTE — Anesthesia Preprocedure Evaluation (Signed)
ANESTHESIA PRE-OP EVALUATION  Planned Procedure: CYSTOLITHOPAXY WITH LASER (N/A Bladder)  Review of Systems     anesthesia history negative     patient summary reviewed  nursing notes reviewed        Pulmonary   asthma,   Cardiovascular    Hypertension ,No peripheral edema,        GI/Hepatic/Renal   negative GI/hepatic/renal ROS,      Endo/Other    obesity,       Neuro/Psych/MS   negative neuro/psych ROS,      Cancer  negative hematology/oncology ROS,                    Physical Assessment      Patient summary reviewed and Nursing notes reviewed   Airway       Mallampati: III    TM distance: >3 FB    Neck ROM: full  Mouth Opening: good.  Facial hair  Beard  No endotracheal tube present  No Tracheostomy present    Dental       Dentition intact             Pulmonary    Breath sounds clear to auscultation  (-) no rhonchi, no decreased breath sounds, no wheezes, no rales and no stridor     Cardiovascular    Rhythm: regular  Rate: Normal  (-) no friction rub, carotid bruit is not present, no peripheral edema and no murmur     Other findings            Plan  ASA 2     Planned anesthesia type: general                  Anesthesia issues/risks discussed are: Stroke, Cardiac Events/MI, Aspiration, Sore Throat and Difficult Airway.  Anesthetic plan and risks discussed with patient.          Patient's NPO status is appropriate for Anesthesia.           Plan discussed with CRNA.                 EKG: Not ordered  CXR: Not ordered  Other Studies: Hgb 12.5

## 2019-11-13 NOTE — H&P (Signed)
[facility]  Department of Urologic Surgery   Pre-operative History and Physical    Jason Ball  [id number]      CC:   1. Urethral stone with subsequent urinary retention s/p foley placement    HPI: Jason Ball is a 59 y.o. male with a history of above who presents for cystolitholapaxy.    PMHx:   Past Medical History:   Diagnosis Date    Asthma     well controlled without medications    Blurred vision     H/O urinary tract infection     10/28/19 antibiotics    HTN (hypertension)     Hyperlipidemia 02/02/2017    Hypertension     Lymphoma (CMS [facility])     Obesity     Palpitations     with anxiety    Pyelonephritis 10/28/2019    Ringing in ears     Sinusitis, chronic     Tattoos     x1 left upper arm          PSHx:   Past Surgical History:   Procedure Laterality Date    HX ADENOIDECTOMY      HX COLONOSCOPY      HX EYE SURGERY      eyelid lift - 2013    HX LYMPH NODE DISSECTION      HX TONSILLECTOMY           Family Hx: Marland Kitchen  Family Medical History:       Problem Relation (Age of Onset)    Cancer Father    HTN <20 y.o. Other    Heart Disease Other    No Known Problems Brother, Son, Daughter, Son    Other Mother              Social Hx:   Social History     Socioeconomic History    Marital status: Married     Spouse name: Not on file    Number of children: 3    Years of education: 14    Highest education level: Not on file   Occupational History    Occupation: self employed    Tobacco Use    Smoking status: Never Smoker    Smokeless tobacco: Never Used   Substance and Sexual Activity    Alcohol use: Yes     Comment: 1 beer every 3 months    Drug use: No    Sexual activity: Not on file   Other Topics Concern    Ability to Walk 1 Flight of Steps without SOB/CP Yes    Routine Exercise No    Ability to Walk 2 Flight of Steps without SOB/CP Yes    Unable to Ambulate Not Asked    Total Care Not Asked    Ability To Do Own ADL's Yes    Uses Walker Not Asked    Other Activity Level Yes     Comment: Programmer, systems Not Asked    Abuse/Domestic Violence Not Asked    Caffeine Concern Not Asked    Calcium intake adequate Not Asked    Computer Use Not Asked    Drives Not Asked    Exercise Concern Not Asked    Helmet Use Not Asked    Seat Belt Not Asked    Special Diet Not Asked    Sunscreen used Not Asked    Uses Cane No    Uses walker No    Uses  wheelchair No    Right hand dominant Yes    Left hand dominant Not Asked    Ambidextrous Not Asked    Shift Work Not Asked    Unusual Sleep-Wake Schedule Not Asked   Social History Narrative    Denies use of OTC substances (caffeine pills, pseudoephedrine products, dextromethorphan products, and herbal preparations). Denies past/current misuse of prescription medications (opioids, sedatives, stimulants). Denies marijuana use. Denies past/current use of illicit drugs (cocaine, heroin, methamphetamine, hallucinogens, and inhalants).          Social Determinants of Health     Financial Resource Strain:     Difficulty of Paying Living Expenses:    Food Insecurity:     Worried About Charity fundraiser in the Last Year:     Arboriculturist in the Last Year:    Transportation Needs:     Film/video editor (Medical):     Lack of Transportation (Non-Medical):    Physical Activity:     Days of Exercise per Week:     Minutes of Exercise per Session:    Stress:     Feeling of Stress :    Intimate Partner Violence:     Fear of Current or Ex-Partner:     Emotionally Abused:     Physically Abused:     Sexually Abused:      Medications:   No current outpatient medications on file.     Allergies:   Allergies   Allergen Reactions    Bactrim [Sulfamethoxazole-Trimethoprim]     No Known Allergies        ROS:  All 10 systems were reviewed.  There were pertinent positives in: Genital urinary system  There are no new changes to the prior reviewed review of systems.    Physical Exam:  BP 136/84   Pulse 94   Temp 36.7 C (98.1 F)   Resp 18   Ht 1.803 m (5\' 11" )   Wt 124 kg (273  lb 9.5 oz)   SpO2 96%   BMI 38.16 kg/m       General - Alert/oriented; NAD  Neck - Supple, trachea midline  Lungs - Respirations are unlabored, good inspiratory effort  Abdomen - Non-distended   Musculoskeletal - 4 extremities intact  Neurological - CN II-XII Grossly intact  GU system - Deferred. Plan to examine in OR    Assessment:  59 y.o. male with:     1. Urethral stone with subsequent urinary retention s/p foley placement    Plan:  To OR for cystolitholapaxy  Consent obtained  The patient understands the risks of the procedure, which include bleeding, infection, recurrence, damage to the surrounding structures, failure to correct pathology, loss of sensation, and the risks of anesthesia (heart attack, stroke, death, etc.)    All questions of patient and family answered    Lavon Paganini. Christoper Fabian, MD  PGY-4  [facility] Department of Urology  Pager [phone]  11/13/2019, 12:25    I saw and examined the patient.  I reviewed the resident's note.  I agree with the findings and plan of care as documented in the resident's note.  Any exceptions/additions are edited/noted.    Ann Held, MD

## 2019-11-13 NOTE — Anesthesia Postprocedure Evaluation (Signed)
Anesthesia Post Op Evaluation    Patient: Jason Ball  Procedure(s):  CYSTOSCOPY  URETHROSCOPY    Last Vitals:Temperature: 36.8 C (98.2 F) (11/13/19 1500)  Heart Rate: 84 (11/13/19 1500)  BP (Non-Invasive): (!) 151/92 (11/13/19 1500)  Respiratory Rate: 15 (11/13/19 1500)  SpO2: 96 % (11/13/19 [id number])    No complications documented.    Patient is sufficiently recovered from the effects of anesthesia to participate in the evaluation and has returned to their pre-procedure level.  Patient location during evaluation: PACU       Patient participation: complete - patient participated  Level of consciousness: awake and alert and responsive to verbal stimuli    Pain management: adequate  Airway patency: patent    Anesthetic complications: no  Cardiovascular status: acceptable  Respiratory status: acceptable  Hydration status: acceptable  Patient post-procedure temperature: Pt Normothermic   PONV Status: Absent

## 2019-11-13 NOTE — OR Surgeon (Signed)
[facility] OF UROLOGY   OPERATION SUMMARY    PATIENT NAME: Fairplay NUMBER: [id number]  DATE OF SERVICE: 11/13/2019  DATE OF BIRTH:      PREOPERATIVE DIAGNOSIS:  1. Urethral stone with subsequent urinary retention s/p foley placement    POSTOPERATIVE DIAGNOSIS: No stone seen. Moderate BPH.    NAME OF PROCEDURE:    1. Cystourethroscopy    INDICATIONS FOR PROCEDURE:  Jason Ball is a 59 y.o. male with a history of above who presents for cystolitholapaxy.    OPERATIVE FINDINGS:    1. No stones found in bladder or urethra  2. Moderate prostatic enlargement    SURGEONS:   Ann Held (Staff); Jonnie Kind, MD (resident).    ANESTHESIA: General LMA anesthesia.    ESTIMATED BLOOD LOSS: Minimal.    BLOOD GIVEN: None.    FLUIDS GIVEN: Please see anesthesia report.    COMPLICATIONS: None.    TUBES: None.    DRAINS: None.    SPECIMENS: None  IMPLANTS:  None    DESCRIPTION OF PROCEDURE: Informed consent was obtained.  The patient had time to think about the risks and benefits of the operation. Patient was then taken back to the operating room and placed in dorsal lithotomy position and general anesthesia was then induced.  Their pressure points were padded and secured. A surgical timeout was performed identifying the correct patient, procedure, surgical site, and allergies. Antibiotics were completed prior to scope insertion. The patient's genitalia were prepped and draped in sterile fashion. A 22-French rigid cystoscope gained entry into the urethra and the bladder under direct vision. Once inside, panendoscopic views including anterior and lateral walls, floor, dome and trigone were obtained. No stones were seen. We then slowed brought the scope back and performed urethroscopy; no stones were noted in the urethra either. The bladder was then emptied and the procedure terminated.    Ann Held was present and participated in the entire case.     DISPOSITION:     -Home discharge    -Start Flomax  -RTC 6 weeks        Lavon Paganini. Christoper Fabian, MD  PGY-4  [facility] [place] Department of Urology  Pager [phone]  11/13/2019, 14:07  I was present for the entire viewing portion of the endoscopy from insertion to removal of scope.    Ann Held, MD

## 2019-11-13 NOTE — Anesthesia Transfer of Care (Signed)
ANESTHESIA TRANSFER OF CARE   Jason Ball is a 59 y.o. ,male, Weight: 124 kg (273 lb 9.5 oz)   had Procedure(s):  CYSTOSCOPY  URETHROSCOPY  performed  11/13/19   Primary Service: Ann Held, MD    Past Medical History:   Diagnosis Date   . Asthma     well controlled without medications   . Blurred vision    . H/O urinary tract infection     10/28/19 antibiotics   . HTN (hypertension)    . Hyperlipidemia 02/02/2017   . Hypertension    . Lymphoma (CMS [place])    . Obesity    . Palpitations     with anxiety   . Pyelonephritis 10/28/2019   . Ringing in ears    . Sinusitis, chronic    . Tattoos     x1 left upper arm      Allergy History as of 11/13/19     NO KNOWN ALLERGIES       Noted Status Severity Type Reaction    10/14/13 0934 Saverio Danker Dare, APRN 10/14/13 Active             SULFAMETHOXAZOLE-TRIMETHOPRIM       Noted Status Severity Type Reaction    10/29/19 1048 Bhushan, Daine Floras, MD 10/29/19 Active                 I completed my transfer of care / handoff to the receiving personnel during which we discussed:  Access, Airway, All key/critical aspects of case discussed, Analgesia, Antibiotics, Expectation of post procedure, Fluids/Product, Gave opportunity for questions and acknowledgement of understanding, Labs and PMHx                                                                    Last OR Temp: Temperature: 36 C (96.8 F)  ABG:  POTASSIUM   Date Value Ref Range Status   10/30/2019 3.7 3.5 - 5.1 mmol/L Final   11/18/2013 4.0 3.5 - 5.1 mmol/L Final     KETONES   Date Value Ref Range Status   10/28/2019 5  (A) Negative mg/dL Final     KAPPA FREE LIGHT CHAINS   Date Value Ref Range Status   11/06/2013 1.85 0.33 - 1.94 mg/dL Final     KAP/LAM FREE LT CHAIN RATIO   Date Value Ref Range Status   11/06/2013 0.623 0.26 - 1.65 mg/dL Final     Comment:     Serum free light chain testing is used to screen for monoclonal  proteins and to monitor patients with monoclonal gammopathy.  Results  must be  interpreted with clinical findings and other laboratory test  results including serum protein electrophoresis and immunofixation.  Test developed and performance on this sample type validated at [facility]  using an FDA cleared assay.     CALCIUM   Date Value Ref Range Status   10/30/2019 8.9 8.5 - 10.2 mg/dL Final   11/18/2013 9.4 8.5 - 10.4 mg/dL Final     Calculated P Axis   Date Value Ref Range Status   07/28/2017 48 deg Final     Calculated T Axis   Date Value Ref Range Status   07/28/2017 30 deg Final     GLUCOSE, POINT  OF CARE   Date Value Ref Range Status   11/11/2013 96 70 - 105 mg/dL Final     Comment:     Cleaned MeterNondiabeticFasting     BETA-2-MICROGLOBULIN, SERUM   Date Value Ref Range Status   11/06/2013 1.58 0.80 - 2.34 mcg/mL Final     Airway:* No LDAs found *  Blood pressure (!) 156/94, pulse 78, temperature 36 C (96.8 F), resp. rate 16, height 1.803 m (5\' 11" ), weight 124 kg (273 lb 9.5 oz), SpO2 99 %.

## 2019-11-13 NOTE — Progress Notes (Signed)
[facility]  Department of Surgery  Discharge Day Note    Jason Ball  Date of service: 11/13/2019  Date of Admission:  11/13/2019  Hospital Day:  LOS: 0 days     Examination at discharge:  Vital Signs: Temperature: 36 C (96.8 F) (11/13/19 1335)  Heart Rate: 78 (11/13/19 1335)  BP (Non-Invasive): (!) 156/94 (11/13/19 1335)  Respiratory Rate: 16 (11/13/19 1335)  SpO2: 99 % (11/13/19 1335)  Pain Score (Numeric, Faces): 0 (11/13/19 1335)  General: No distress    Assessment:   59 y.o. male s/p cystourethroscopy    Disposition/Plan :   -Home discharge    -Start Flomax  -RTC 6 weeks to urology clinic      Lavon Paganini. Christoper Fabian, MD  PGY-4  [facility] Department of Urology  Pager [phone]  11/13/2019, 14:06  Discussed with the patient and all questions fully answered. He will call me if any problems arise.  Ann Held, MD  11/13/2019, 16:29

## 2019-11-13 NOTE — Discharge Instructions (Signed)
SURGICAL DISCHARGE INSTRUCTIONS     Dr. Ann Held, MD  performed your CYSTOSCOPY, URETHROSCOPY today at the [place]:  Monday through Friday from 6 a.m. - 7 p.m.: [phone]  Between 7 p.m. - 6 a.m., weekends and holidays:  Call [facility] at [phone] or [phone].    PLEASE SEE WRITTEN HANDOUTS AS DISCUSSED BY YOUR NURSE:      SIGNS AND SYMPTOMS OF A WOUND / INCISION INFECTION   Be sure to watch for the following:   Increase in redness or red streaks near or around the wound or incision.   Increase in pain that is intense or severe and cannot be relieved by the pain medication that your doctor has given you.   Increase in swelling that cannot be relieved by elevation of a body part, or by applying ice, if permitted.   Increase in drainage, or if yellow / green in color and smells bad. This could be on a dressing or a cast.   Increase in fever for longer than 24 hours, or an increase that is higher than 101 degrees Fahrenheit (normal body temperature is 98 degrees Fahrenheit). The incision may feel warm to the touch.    **CALL YOUR DOCTOR IF ONE OR MORE OF THESE SIGNS / SYMPTOMS SHOULD OCCUR.    ANESTHESIA INFORMATION   ANESTHESIA -- ADULT PATIENTS:  You have received intravenous sedation / general anesthesia, and you may feel drowsy and light-headed for several hours. You may even experience some forgetfulness of the procedure. DO NOT DRIVE A MOTOR VEHICLE or perform any activity requiring complete alertness or coordination until you feel fully awake in about 24-48 hours. Do not drink alcoholic beverages for at least 24 hours. Do not stay alone, you must have a responsible adult available to be with you. You may also experience a dry mouth or nausea for 24 hours. This is a normal side effect and will disappear as the effects of the medication wear off.    REMEMBER   If you experience any difficulty breathing, chest pain, bleeding that you feel is  excessive, persistent nausea or vomiting or for any other concerns:  Call your physician Dr. Kellie Simmering at [phone] or [phone]. You may also ask to have the  doctor on call paged. They are available to you 24 hours a day.    SPECIAL INSTRUCTIONS / COMMENTS       FOLLOW-UP APPOINTMENTS   Please call patient services at [phone] or [phone] to schedule a date / time of return. They are open Monday - Friday from 7:30 am - 5:00 pm.

## 2019-12-06 ENCOUNTER — Ambulatory Visit (VACCINATION_CLINIC): Payer: BC Managed Care – PPO

## 2019-12-27 ENCOUNTER — Ambulatory Visit (VACCINATION_CLINIC): Payer: Self-pay

## 2019-12-30 ENCOUNTER — Encounter (INDEPENDENT_AMBULATORY_CARE_PROVIDER_SITE_OTHER): Payer: Self-pay | Admitting: NURSE PRACTITIONER, FAMILY

## 2019-12-30 ENCOUNTER — Ambulatory Visit (VACCINATION_CLINIC): Payer: BC Managed Care – PPO

## 2020-02-06 ENCOUNTER — Encounter (INDEPENDENT_AMBULATORY_CARE_PROVIDER_SITE_OTHER): Payer: Self-pay | Admitting: Family Medicine

## 2020-02-24 ENCOUNTER — Other Ambulatory Visit (INDEPENDENT_AMBULATORY_CARE_PROVIDER_SITE_OTHER): Payer: Self-pay | Admitting: Family Medicine

## 2020-09-25 ENCOUNTER — Encounter (HOSPITAL_BASED_OUTPATIENT_CLINIC_OR_DEPARTMENT_OTHER): Payer: Self-pay | Admitting: HEMATOLOGY/ONCOLOGY

## 2020-11-25 ENCOUNTER — Other Ambulatory Visit (INDEPENDENT_AMBULATORY_CARE_PROVIDER_SITE_OTHER): Payer: Self-pay | Admitting: Family Medicine

## 2021-01-04 ENCOUNTER — Other Ambulatory Visit (INDEPENDENT_AMBULATORY_CARE_PROVIDER_SITE_OTHER): Payer: Self-pay | Admitting: Family Medicine

## 2021-03-03 ENCOUNTER — Encounter (INDEPENDENT_AMBULATORY_CARE_PROVIDER_SITE_OTHER): Payer: Self-pay | Admitting: Family Medicine

## 2021-03-10 ENCOUNTER — Encounter (INDEPENDENT_AMBULATORY_CARE_PROVIDER_SITE_OTHER): Payer: Self-pay | Admitting: Family Medicine

## 2021-09-06 ENCOUNTER — Other Ambulatory Visit (INDEPENDENT_AMBULATORY_CARE_PROVIDER_SITE_OTHER): Payer: Self-pay | Admitting: Family Medicine

## 2021-09-09 ENCOUNTER — Other Ambulatory Visit (INDEPENDENT_AMBULATORY_CARE_PROVIDER_SITE_OTHER): Payer: Self-pay | Admitting: Family Medicine

## 2021-09-16 ENCOUNTER — Other Ambulatory Visit (INDEPENDENT_AMBULATORY_CARE_PROVIDER_SITE_OTHER): Payer: Self-pay | Admitting: Family Medicine

## 2021-09-16 MED ORDER — AMLODIPINE 10 MG-BENAZEPRIL 40 MG CAPSULE
1.0000 | ORAL_CAPSULE | Freq: Every day | ORAL | 0 refills | Status: DC
Start: 2021-09-16 — End: 2021-11-01

## 2021-10-25 ENCOUNTER — Encounter (HOSPITAL_BASED_OUTPATIENT_CLINIC_OR_DEPARTMENT_OTHER): Payer: Self-pay | Admitting: HEMATOLOGY/ONCOLOGY

## 2021-10-27 ENCOUNTER — Encounter (INDEPENDENT_AMBULATORY_CARE_PROVIDER_SITE_OTHER): Payer: Self-pay | Admitting: Family Medicine

## 2021-11-01 ENCOUNTER — Other Ambulatory Visit (INDEPENDENT_AMBULATORY_CARE_PROVIDER_SITE_OTHER): Payer: Self-pay | Admitting: Family Medicine

## 2021-11-01 MED ORDER — AMLODIPINE 10 MG-BENAZEPRIL 40 MG CAPSULE
1.0000 | ORAL_CAPSULE | Freq: Every day | ORAL | 0 refills | Status: DC
Start: 2021-11-01 — End: 2021-12-09

## 2021-11-09 ENCOUNTER — Encounter (INDEPENDENT_AMBULATORY_CARE_PROVIDER_SITE_OTHER): Payer: Self-pay | Admitting: Family Medicine

## 2021-11-22 ENCOUNTER — Encounter (INDEPENDENT_AMBULATORY_CARE_PROVIDER_SITE_OTHER): Payer: Self-pay | Admitting: Family Medicine

## 2021-11-22 ENCOUNTER — Ambulatory Visit (INDEPENDENT_AMBULATORY_CARE_PROVIDER_SITE_OTHER): Payer: BC Managed Care – PPO | Admitting: Family Medicine

## 2021-11-22 ENCOUNTER — Other Ambulatory Visit: Payer: Self-pay

## 2021-11-22 VITALS — BP 139/88 | HR 100 | Temp 97.7°F | Resp 17 | Ht 71.0 in | Wt 284.0 lb

## 2021-11-22 DIAGNOSIS — R739 Hyperglycemia, unspecified: Secondary | ICD-10-CM

## 2021-11-22 DIAGNOSIS — E785 Hyperlipidemia, unspecified: Secondary | ICD-10-CM

## 2021-11-22 DIAGNOSIS — I10 Essential (primary) hypertension: Secondary | ICD-10-CM

## 2021-11-22 NOTE — Progress Notes (Signed)
[facility]  [address] [place] [address]  Dept Phone: [phone]  Dept Fax: [phone]    Jason Ball    [id number]    Date of Service: 11/22/2021   Chief complaint:   Chief Complaint   Patient presents with   . Hyperlipidemia       Subjective:   Doing well. No complaints    Tolerating his medications. Only on the BP med and baby aspirin    Been following with oncology in [place] and doing well.     Due fasting labs    Patient Active Problem List    Diagnosis   . Pyelonephritis   . Hyperlipidemia   . Lymphoma (CMS [place])   . Asthma   . Hypertension   . Palpitations   . Obesity   . Sinusitis, chronic     Social History     Socioeconomic History   . Marital status: Married     Spouse name: Not on file   . Number of children: 3   . Years of education: 35   . Highest education level: Not on file   Occupational History   . Occupation: self employed    Tobacco Use   . Smoking status: Never   . Smokeless tobacco: Never   Substance and Sexual Activity   . Alcohol use: Yes     Comment: 1 beer every 3 months   . Drug use: No   . Sexual activity: Not on file   Other Topics Concern   . Ability to Walk 1 Flight of Steps without SOB/CP Yes   . Routine Exercise No   . Ability to Walk 2 Flight of Steps without SOB/CP Yes   . Unable to Ambulate Not Asked   . Total Care Not Asked   . Ability To Do Own ADL's Yes   . Uses Walker Not Asked   . Other Activity Level Yes     Comment: mechanic activities   . Uses Cane Not Asked   . Abuse/Domestic Violence Not Asked   . Caffeine Concern Not Asked   . Calcium intake adequate Not Asked   . Computer Use Not Asked   . Drives Not Asked   . Exercise Concern Not Asked   . Helmet Use Not Asked   . Seat Belt Not Asked   . Special Diet Not Asked   . Sunscreen used Not Asked   . Uses Cane No   . Uses walker No   . Uses wheelchair No   . Right hand dominant Yes   . Left hand dominant Not Asked   . Ambidextrous Not Asked   . Shift Work Not Asked   . Unusual  Sleep-Wake Schedule Not Asked   Social History Narrative    Denies use of OTC substances (caffeine pills, pseudoephedrine products, dextromethorphan products, and herbal preparations). Denies past/current misuse of prescription medications (opioids, sedatives, stimulants). Denies marijuana use. Denies past/current use of illicit drugs (cocaine, heroin, methamphetamine, hallucinogens, and inhalants).          Social Determinants of Health     Financial Resource Strain: Not on file   Transportation Needs: Not on file   Social Connections: Not on file   Intimate Partner Violence: Not on file   Housing Stability: Not on file     Family Medical History:     Problem Relation (Age of Onset)    Cancer Father    HTN <20 y.o. Other  Heart Disease Other    No Known Problems Brother, Son, Daughter, Son    Other Mother          Current Outpatient Medications   Medication Sig   . Amlodipine-Benazepril 10-40 mg Oral Capsule Take 1 Capsule by mouth Once a day Take 1 capsule by mouth once daily Strength: 10-40 mg   . aspirin (ECOTRIN) 81 mg Oral Tablet, Delayed Release (E.C.) Take 1 Tablet (81 mg total) by mouth Once a day       Objective:     BP (!) 158/90   Pulse 100   Temp 36.5 C (97.7 F) (Thermal Scan)   Resp 17   Ht 1.803 m ('5\' 11"'$ )   Wt 129 kg (284 lb)   SpO2 97%   BMI 39.61 kg/m       BP Readings from Last 3 Encounters:   11/22/21 (!) 158/90   11/13/19 (!) 151/92   11/05/19 (!) 146/80     Wt Readings from Last 3 Encounters:   11/22/21 129 kg (284 lb)   11/13/19 124 kg (273 lb 9.5 oz)   11/05/19 128 kg (283 lb)     General appearance: alert, oriented x 3, in his normal state, cooperative, not in apparent distress, appearing stated age   61:  Eyes:  Pupils equal round react like accommodation extraocular muscles intact. Ears within normal limits throat clear, tonsils normal, no cervical lymphadenopathy thyroid normal  Lungs: clear to auscultation bilaterally, respirations non-labored  Heart: regular rate and  rhythm, S1, S2 normal, no murmur, PMI non-diffuse  Abdomen: soft, non-tender. Bowel sounds normal.  Extremities: extremities normal, atraumatic, no cyanosis or edema, pulses intact in upper and lower extremities    Assessment and Plan     Jason Ball was seen today for hyperlipidemia.    Diagnoses and all orders for this visit:    Hyperlipidemia, unspecified hyperlipidemia type  Off the atorvastatin. Not sure when it was stopped. Will check labs off of it. Keep working on diet and exercise.   -     COMPREHENSIVE METABOLIC PNL, FASTING; Future  -     LIPID PANEL; Future    Primary hypertension  Same medications. Check labs.   -     CBC; Future     Hyperglycemia  Check A1C.   -     HGA1C (HEMOGLOBIN A1C WITH EST AVG GLUCOSE); Future      Discussed his compliance with appts and his multiple no shows. Does he need a PCP closer to where he lives in [place]? States he will do better and call if can't make appts. Doesn't want to change doctors.         Orders Placed This Encounter   . CBC   . COMPREHENSIVE METABOLIC PNL, FASTING   . LIPID PANEL   . HGA1C (HEMOGLOBIN A1C WITH EST AVG GLUCOSE)       S. "Paula Compton, D.O.

## 2021-11-22 NOTE — Nursing Note (Signed)
11/22/21 1357   Depression Screen   Little interest or pleasure in doing things. 0   Feeling down, depressed, or hopeless 0   PHQ 2 Total 0

## 2021-12-09 ENCOUNTER — Other Ambulatory Visit (INDEPENDENT_AMBULATORY_CARE_PROVIDER_SITE_OTHER): Payer: Self-pay | Admitting: Family Medicine

## 2022-05-31 ENCOUNTER — Encounter (INDEPENDENT_AMBULATORY_CARE_PROVIDER_SITE_OTHER): Payer: Self-pay | Admitting: Family Medicine

## 2022-06-22 ENCOUNTER — Encounter (INDEPENDENT_AMBULATORY_CARE_PROVIDER_SITE_OTHER): Payer: BC Managed Care – PPO | Admitting: Family Medicine

## 2022-07-30 ENCOUNTER — Other Ambulatory Visit (INDEPENDENT_AMBULATORY_CARE_PROVIDER_SITE_OTHER): Payer: Self-pay | Admitting: Family Medicine

## 2022-08-15 ENCOUNTER — Encounter (INDEPENDENT_AMBULATORY_CARE_PROVIDER_SITE_OTHER): Payer: BC Managed Care – PPO | Admitting: Family Medicine

## 2022-08-23 ENCOUNTER — Encounter (INDEPENDENT_AMBULATORY_CARE_PROVIDER_SITE_OTHER): Payer: BC Managed Care – PPO | Admitting: Family Medicine

## 2022-09-27 ENCOUNTER — Encounter (INDEPENDENT_AMBULATORY_CARE_PROVIDER_SITE_OTHER): Payer: 59 | Admitting: Family Medicine

## 2022-10-12 ENCOUNTER — Encounter (INDEPENDENT_AMBULATORY_CARE_PROVIDER_SITE_OTHER): Payer: 59 | Admitting: Family Medicine

## 2022-11-14 ENCOUNTER — Encounter (INDEPENDENT_AMBULATORY_CARE_PROVIDER_SITE_OTHER): Payer: Self-pay | Admitting: Family Medicine

## 2022-12-06 ENCOUNTER — Encounter (INDEPENDENT_AMBULATORY_CARE_PROVIDER_SITE_OTHER): Payer: 59 | Admitting: Family Medicine

## 2023-02-17 LAB — ENTER/EDIT EXTERNAL COMMON LAB RESULTS
ALBUMIN: 4.2
ALKALINE PHOSPHATASE: 63
ALT (SGPT): 11
AST (SGOT): 15
BILIRUBIN, TOTAL: 0.6 mg/dL
BUN: 14
CALCIUM: 9.1
CARBON DIOXIDE: 24
CHLORIDE: 109
CHLORIDE: 109
CREATININE: 1
CREATININE: 1
CRRT - IONIZED CALCIUM: 1.12 mmol/L
EGFR: 86
GLUCOSE: 94
GLUCOSE: 94
HCT: 44.8
HGB: 14.6
MAGNESIUM: 1.9
PLATELET COUNT: 339
POTASSIUM: 3.8
POTASSIUM: 3.8
PROTEIN, TOTAL: 7.8 g/dL
PROTEIN, TOTAL: 7.8 g/dL
RBC: 5.12
SODIUM: 143
SODIUM: 143
TOTAL LDH: 161
WBC: 8.7

## 2023-08-04 LAB — ENTER/EDIT EXTERNAL COMMON LAB RESULTS
ALBUMIN: 3.9
ALKALINE PHOSPHATASE: 67
ALT (SGPT): 10
AST (SGOT): 14
BILIRUBIN, TOTAL: 0.5 mg/dL
BUN: 14
CALCIUM: 8.3
CARBON DIOXIDE: 26
CHLORIDE: 105
CREATININE: 0.9
CRRT - IONIZED CALCIUM: 1.15 mmol/L
FERRITIN: 6
FOLATE, SERUM: 16.4
GLUCOSE: 95
HGB: 11.7
IRON: 39
MAGNESIUM: 1.7
PLATELET: 399
POTASSIUM: 4.1
PROTEIN, TOTAL: 7.5 g/dL
RBC: 4.96
SODIUM: 141
TOTAL LDH: 166
VITAMIN B12: 196
WBC: 8.6

## 2023-10-27 LAB — ENTER/EDIT EXTERNAL COMMON LAB RESULTS
ALBUMIN: 3.8
ALKALINE PHOSPHATASE: 59
ALT: 12
AST: 16
BILIRUBIN, TOTAL: 0.5 mg/dL
BUN: 12
CALCIUM: 7.9
CARBON DIOXIDE: 27
CHLORIDE: 110
CREATININE: 1
CRRT - IONIZED CALCIUM: 1.1 mmol/L
GLUCOSE: 105
HCT: 40.1
HGB: 12.8
MAGNESIUM: 1.9
MCH: 26.1
MPV: 7.4
PLATELET COUNT: 343
POTASSIUM: 4.1
PROTEIN, TOTAL: 7.7 g/dL
RBC: 4.93
RDW: 17.2
SODIUM: 141
TOTAL LDH: 161
WBC: 7.2

## 2023-10-30 ENCOUNTER — Ambulatory Visit
Payer: 59 | Attending: Student in an Organized Health Care Education/Training Program | Admitting: Student in an Organized Health Care Education/Training Program

## 2023-10-30 ENCOUNTER — Ambulatory Visit (HOSPITAL_BASED_OUTPATIENT_CLINIC_OR_DEPARTMENT_OTHER): Payer: 59

## 2023-10-30 ENCOUNTER — Other Ambulatory Visit: Payer: Self-pay

## 2023-10-30 ENCOUNTER — Encounter (HOSPITAL_BASED_OUTPATIENT_CLINIC_OR_DEPARTMENT_OTHER): Payer: Self-pay | Admitting: Student in an Organized Health Care Education/Training Program

## 2023-10-30 VITALS — BP 144/90 | HR 85 | Temp 97.5°F | Ht 71.42 in | Wt 270.5 lb

## 2023-10-30 DIAGNOSIS — J3089 Other allergic rhinitis: Secondary | ICD-10-CM

## 2023-10-30 DIAGNOSIS — Z23 Encounter for immunization: Secondary | ICD-10-CM | POA: Insufficient documentation

## 2023-10-30 DIAGNOSIS — R7303 Prediabetes: Secondary | ICD-10-CM

## 2023-10-30 DIAGNOSIS — E785 Hyperlipidemia, unspecified: Secondary | ICD-10-CM

## 2023-10-30 DIAGNOSIS — J45909 Unspecified asthma, uncomplicated: Secondary | ICD-10-CM | POA: Insufficient documentation

## 2023-10-30 DIAGNOSIS — J309 Allergic rhinitis, unspecified: Secondary | ICD-10-CM

## 2023-10-30 DIAGNOSIS — I10 Essential (primary) hypertension: Secondary | ICD-10-CM | POA: Insufficient documentation

## 2023-10-30 DIAGNOSIS — Z114 Encounter for screening for human immunodeficiency virus [HIV]: Secondary | ICD-10-CM

## 2023-10-30 DIAGNOSIS — D509 Iron deficiency anemia, unspecified: Secondary | ICD-10-CM

## 2023-10-30 LAB — LIPID PANEL
CHOL/HDL RATIO: 3.6
CHOLESTEROL: 189 mg/dL (ref 100–200)
HDL CHOL: 53 mg/dL (ref >=50)
LDL CALC: 123 mg/dL — ABNORMAL HIGH (ref <100)
NON-HDL: 136 mg/dL (ref <=190)
TRIGLYCERIDES: 73 mg/dL (ref <150)
VLDL CALC: 13 mg/dL (ref <30)

## 2023-10-30 LAB — BASIC METABOLIC PANEL
ANION GAP: 7 mmol/L (ref 4–13)
BUN/CREA RATIO: 14 (ref 6–22)
BUN: 13 mg/dL (ref 8–25)
CALCIUM: 9.1 mg/dL (ref 8.6–10.3)
CHLORIDE: 105 mmol/L (ref 96–111)
CO2 TOTAL: 25 mmol/L (ref 23–31)
CREATININE: 0.95 mg/dL (ref 0.75–1.35)
ESTIMATED GFR - MALE: >90 mL/min/BSA (ref >=60)
GLUCOSE: 109 mg/dL (ref 65–125)
POTASSIUM: 4.5 mmol/L (ref 3.5–5.1)
SODIUM: 137 mmol/L (ref 136–145)

## 2023-10-30 LAB — HGA1C (HEMOGLOBIN A1C WITH EST AVG GLUCOSE)
ESTIMATED AVERAGE GLUCOSE: 123 mg/dL
HEMOGLOBIN A1C: 5.9 % — ABNORMAL HIGH (ref 4.0–5.6)

## 2023-10-30 LAB — HIV1/HIV2 SCREEN, COMBINED ANTIGEN AND ANTIBODY: HIV SCREEN, COMBINED ANTIGEN & ANTIBODY: NEGATIVE

## 2023-10-30 MED ORDER — AMLODIPINE 10 MG-BENAZEPRIL 40 MG CAPSULE
1.0000 | ORAL_CAPSULE | Freq: Every day | ORAL | 1 refills | Status: AC
Start: 2023-10-30 — End: ?

## 2023-10-30 MED ORDER — ALBUTEROL SULFATE HFA 90 MCG/ACTUATION AEROSOL INHALER
1.0000 | INHALATION_SPRAY | Freq: Four times a day (QID) | RESPIRATORY_TRACT | 5 refills | Status: AC | PRN
Start: 2023-10-30 — End: ?

## 2023-10-30 MED ORDER — FLUTICASONE PROPIONATE 50 MCG/ACTUATION NASAL SPRAY,SUSPENSION: 1.0000 | Freq: Every day | NASAL | 1 refills | Status: DC

## 2023-10-30 MED ORDER — LORATADINE 10 MG TABLET: 10.0000 mg | ORAL_TABLET | Freq: Every day | ORAL | 1 refills | Status: DC

## 2023-10-30 NOTE — Nursing Note (Signed)
Faxed ROI to [facility] for most recent Colonoscopy and Endoscopy Report.    Faxed ROI to Dr. Eunice Blase at [facility] for most recent office visit notes.    [facility] and most recent blood work.    Confirmation received. Alvis Lemmings, RN  10/30/2023, 10:32

## 2023-10-30 NOTE — Progress Notes (Signed)
[facility] [place] [facility]  [address]  [place] [address]  [phone]      ENCOUNTER DATE: 10/30/2023  Name: Jason Ball  Age: 63 y.o.  DOB: [birth year]  Sex: male      CC: Establish Care      HPI:  Jason Ball is a 63 yo male with past medical history of iron deficiency anemia, non-Hodgkin lymphoma, hypertension who presents to geriatric clinic today to establish care.  He was previously following with Dr. Eunice Blase for primary care.      He does report history of iron deficiency anemia.  He saw a hematologist at [facility]  He was started on iron, B12, and omeprazole.  He had an upper and lower endoscopy which showed diverticulosis but otherwise was negative per patient.  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  He was treated with chemotherapy.  He continues to follow with heme/onc at [facility] - next visit is later this week.  We will request records.  It sounds like he will be receiving iron infusions with her and had recent lab work which we will try to request.     He does report trouble with his elbow starting around July of last year.  He has been doing weight lifting and started to have issues with both elbows, both shoulders, and well as his hips.  He has been seeing a chiropractor which he feels is helping.  He was taking a lot of ibuprofen to manage this but reports he stopped taking this 2-3 months ago. He is now off ibuprofen completely and is feeling better.      He reports intermittent left sided neck swelling starting after lifting a jet ski in 2014.  He reports it is normal right now.  He has had prior workup including an ultrasound which was negative.  He does also report about 6 months of congestion which has also impacted his sense of taste and smell.   He has not tried to take anything over the counter for this.   We discussed trying Flonase and Claritin and can follow up if no improvement.        His  blood pressure is a little high today.  He does take it at home a few times per week and is usually around 130/70's at home.  He reports that it is usually higher in the office.   He reports a history of asthma which is well controlled without medications.  He is a non smoker and no history of tobacco products. He denies any history of drug use.  He does not drink anything with alcohol in it.     He lives with his wife of 38 years in [place], [place].  He has a daughter who is a Engineer, civil (consulting) at the children's hospital.  He has two other children as well.  He is independent for iADLs and ADLs.       Patient Active Problem List   Diagnosis    Asthma    Hypertension    Palpitations    Obesity    Sinusitis, chronic    Lymphoma (CMS HCC)    Hyperlipidemia    Pyelonephritis       Past Surgical History:   Procedure Laterality Date    BIOPSY  NASAL Left 10/21/2013    Performed by Ramadan, Thom Chimes, MD at [facility] OR 2 WEST    CASE CANCELLED  07/28/2017    Performed by Otilio Connors, MD at [facility] OR MAIN    CYSTOSCOPY N/A 11/13/2019    Performed by Earnest Rosier, MD at [facility] OR 2 WEST    CYSTOSCOPY WITH URETERAL STENT INSERTION Right 07/28/2017    Performed by Otilio Connors, MD at [facility] OR MAIN    HX ADENOIDECTOMY      HX COLONOSCOPY      HX EYE SURGERY      eyelid lift - 2013    HX LYMPH NODE DISSECTION      HX TONSILLECTOMY      URETEROSCOPY WITH HOLMIUM LASER LITHOTRIPSY Right 07/28/2017    Performed by Otilio Connors, MD at [facility] OR MAIN    URETHROSCOPY  11/13/2019    Performed by Earnest Rosier, MD at [facility] OR 2 WEST       Amlodipine-Benazepril 10-40 mg Oral Capsule, Take 1 capsule by mouth once daily  aspirin (ECOTRIN) 81 mg Oral Tablet, Delayed Release (E.C.), Take 1 Tablet (81 mg total) by mouth Once a day (Patient not taking: Reported on 10/30/2023)  FEROSUL 325 mg (65 mg iron) Oral Tablet, Take 1 Tablet (325 mg total) by mouth Every other day  omeprazole (PRILOSEC) 20 mg Oral Capsule, Delayed Release(E.C.), Take 1  Capsule (20 mg total) by mouth Once a day  VITAMIN B-12 1,000 mcg Oral Tablet, SMARTSIG:1 CAPSULE(S) BY MOUTH DAILY    No facility-administered medications prior to visit.      Allergies   Allergen Reactions    Bactrim [Sulfamethoxazole-Trimethoprim]        Family Medical History:       Problem Relation (Age of Onset)    Cancer Father    HTN <20 y.o. Other    Heart Disease Other    No Known Problems Brother, Son, Daughter, Son    Other Mother            Social History     Socioeconomic History    Marital status: Married    Number of children: 3    Years of education: 14   Occupational History    Occupation: self employed    Tobacco Use    Smoking status: Never    Smokeless tobacco: Never   Substance and Sexual Activity    Alcohol use: Yes     Comment: 1 beer every 3 months    Drug use: No   Other Topics Concern    Ability to Walk 1 Flight of Steps without SOB/CP Yes    Routine Exercise No    Ability to Walk 2 Flight of Steps without SOB/CP Yes    Ability To Do Own ADL's Yes    Other Activity Level Yes     Comment: Curator activities    Uses Cane No    Uses walker No    Uses wheelchair No    Right hand dominant Yes   Social History Narrative    Denies use of OTC substances (caffeine pills, pseudoephedrine products, dextromethorphan products, and herbal preparations). Denies past/current misuse of prescription medications (opioids, sedatives, stimulants). Denies marijuana use. Denies past/current use of illicit drugs (cocaine, heroin, methamphetamine, hallucinogens, and inhalants).            Review of Systems  Other than ROS in the HPI, all other systems were negative.      BP (!) 144/90 (Site: Left Arm, Patient Position: Sitting, Cuff Size: Adult Large)   Pulse 85   Temp 36.4 C (97.5 F) (Temporal)   Ht  1.814 m (5' 11.42")   Wt 123 kg (270 lb 8.1 oz)   SpO2 96%   BMI 37.29 kg/m     General: alert, cooperative, in no distress, vital signs reviewed   Head: Normocephalic, atraumatic   Eyes: pupils equal and  round, conjunctiva clear  ENT:  Mucus membranes moist, no visible oral lesions  Respiratory: clear to auscultation bilaterally, no wheezes  Cardiovascular: regular rate and rhythm, no edema   Abdomen: soft, non-tender, bowel sounds normal   Neurology: alert and oriented, no gross motor/sensory deficits.  Psychiatric:  normal mood and affect, thought process linear     COMPREHENSIVE GERIATRIC ASSESSMENT    Functional Status:  does patient require assistance with the following activities?    A. Basic activities of daily living    Bathing No  Dressing No  Toileting No  Maintaining Continence No  Grooming No  Feeding No  Transferring No    B. Instrumental activities of daily living  Shopping for groceries No  Driving or using public transportation No  Using telephone No  Performing house work No  Doing home repairs No  Preparing meals No  Doing laundry No  Taking medications No  Handling finance No    Pain No    Gait No    Fall/balanceNo    Cognition No, SLUMS 24/30     Mood disorders No, GDS 0/15     Polypharmacy No    Vision Yes, wears glasses     Goal of care/advance care preferences No - discussed and recommended to complete today       ASSESSMENT AND PLAN  Problem List Items Addressed This Visit          Cardiovascular System    Hypertension    Relevant Medications    Amlodipine-Benazepril 10-40 mg Oral Capsule    Other Relevant Orders    BASIC METABOLIC PANEL    Hyperlipidemia    Relevant Orders    LIPID PANEL       Respiratory    Asthma    Relevant Medications    albuterol sulfate (PROVENTIL OR VENTOLIN OR PROAIR) 90 mcg/actuation Inhalation oral inhaler     Other Visit Diagnoses         Allergic rhinitis    -  Primary    Relevant Medications    fluticasone propionate (FLONASE) 50 mcg/actuation Nasal Spray, Suspension    loratadine (CLARITIN) 10 mg Oral Tablet      Need for Tdap vaccination          Need for influenza vaccination        Relevant Orders    Flu Vaccine,6 month-adult,0.5 mL IM (Admin)       Prediabetes        Relevant Orders    HGA1C (HEMOGLOBIN A1C WITH EST AVG GLUCOSE)           Allergic rhinitis   -he reports 6 months of congestion which is bothersome to him   -we will try Flonase and Claritin and follow up if no improvement     Hypertension  -blood pressure 144/90 today -home readings are better around 130/70's.  I asked him to continue to monitor this and let me know if home reading are >130/80  -amlodipine-benazepril 10/40 mg daily   -we will update BMP today     Hyperlipidemia   -it appears medication was recommend by previous PCP in the past  -we will update lipid panel today and will discuss results at next  visit     Prediabetes   -we will update A1c today     Asthma   -well controlled without medications   -albuterol inhaler prescribed and advised to keep on hand if needed     Iron deficiency anemia   -following with heme/onc through [facility] - we will request records from here as well as colonoscopy at [facility]     History of non-hodgkin lymphoma   -follows regularly with oncology at [facility]  -he is due for several vaccines.  He would like to do one today which we will start with flu given the time of year and high prevalence of flu currently.  He will return for nurses visit for Covid, Tdap, shingrix, pneumonia vaccines   -he has previously been tested for hepatitis C.  He is agreeable to one time HIV screening today.  He has never had this in the past.    -we will request records [facility]     The patient was given the opportunity to ask questions and those questions were answered to the patient's satisfaction. The patient was encouraged to call with any additional questions or concerns. Instructed patient to call back if symptoms worsen.   Discussed with patient effects and side effects of medications. Medication safety was discussed. A copy of the patient's medication list was printed and given to the patient. Patient's medications were  reconciled.     Follow up:  in 6 month(s)    Marrianne Mood, MD 10/30/2023, 10:05  Assistant Professor, Geriatrics and Palliative Medicine

## 2023-10-30 NOTE — Nursing Note (Signed)
1. Are you 62 years of age or older? yes  2. Have you ever had a severe reaction to a flu shot? no  3. Are you allergic to latex? no  4. Are you allergic to Thimerosol? no  5. Are you experiencing acute illness symptoms or have you been running a fever? no  6. Do you have a medical condition or taking medications that suppress your immune system? no  7. Have you ever had Guillain-Barre syndrome or other neurologic disorder? no  8. Are you pregnant or breastfeeding? no    Immunization administered       Name Date Dose VIS Date Route    Influenza Vaccine, 6 month-adult 10/30/2023 0.5 mL 04/24/2020 Intramuscular    Site: Left arm    Given By: Alvis Lemmings, RN    Manufacturer: [employer]    Lot: [id number]    NDC: [id number]            Keno Caraway Floydene Flock, RN 10/30/2023, 10:08

## 2023-10-30 NOTE — Patient Instructions (Addendum)
Vaccines to be completed:      Covid booster for 2024-2025   Shingrix (shingles vaccine) - two shot series   Pneumovax (pneumonia vaccine)

## 2023-10-31 ENCOUNTER — Telehealth (HOSPITAL_BASED_OUTPATIENT_CLINIC_OR_DEPARTMENT_OTHER): Payer: Self-pay | Admitting: Student in an Organized Health Care Education/Training Program

## 2023-10-31 ENCOUNTER — Encounter (HOSPITAL_BASED_OUTPATIENT_CLINIC_OR_DEPARTMENT_OTHER): Payer: Self-pay | Admitting: Student in an Organized Health Care Education/Training Program

## 2023-10-31 NOTE — Telephone Encounter (Signed)
Received fax from [facility], regarding patient's medical records. Placing it in your MGP-UTC box.   Franki Cabot, RN 10/31/2023, 09:29

## 2023-10-31 NOTE — Progress Notes (Signed)
[facility]:  H&P dated 10/04/23 from [facility] and Dr. Alcus Dad - he was seen for scheduling of screening colonoscopy.  Colonoscopy was completed on 10/12/23 and done in conjunction with EGD (for anemia).  Post operative diagnosis:  normal EGD, diverticulosis, and anal skin tags.  Next colonoscopy will be due in 10 years.    Marrianne Mood, MD 10/31/2023, 10:38  Assistant Professor, Geriatrics and Palliative Medicine

## 2023-11-01 ENCOUNTER — Telehealth (HOSPITAL_BASED_OUTPATIENT_CLINIC_OR_DEPARTMENT_OTHER): Payer: Self-pay | Admitting: Student in an Organized Health Care Education/Training Program

## 2023-11-01 NOTE — Telephone Encounter (Signed)
Received fax from [facility] with patient records and labs. Updated labs into patient's chart. Placing records in [facility] mailbox for review.    Flora Lipps, Ambulatory Care Assistant

## 2023-11-06 ENCOUNTER — Encounter (HOSPITAL_BASED_OUTPATIENT_CLINIC_OR_DEPARTMENT_OTHER): Payer: Self-pay | Admitting: Student in an Organized Health Care Education/Training Program

## 2023-11-06 NOTE — Progress Notes (Signed)
Records were received from [facility]:      Office note from 10/27/23 by Dr. Sheela Stack for follow up stage IV marginal zone lymphoma.  Listed treatment history includes diagnosis in February 2015 at left sinus with treatment with 6 cycles of R-CVP from March to July of 2015.   At this visit, noted B12 and iron deficiency with EGD/colonoscopy completed at [facility].  They are planning for IV iron infusions with return visit scheduled in 2 months.  There is no primary report from EGD/colonoscopy included.    Jason Mood, MD 11/06/2023, 13:07  Assistant Professor, Geriatrics and Palliative Medicine

## 2023-12-02 ENCOUNTER — Telehealth (HOSPITAL_BASED_OUTPATIENT_CLINIC_OR_DEPARTMENT_OTHER): Payer: Self-pay | Admitting: Student in an Organized Health Care Education/Training Program

## 2023-12-02 NOTE — Telephone Encounter (Signed)
 Faxed ROI to [facility] for 2nd attempt. Confirmation received. Ronnald Ramp, [place]  12/02/2023, 10:47

## 2024-01-05 NOTE — Telephone Encounter (Signed)
 Faxed ROI to [facility] for 3rd attempt. Confirmation received. Elsa Halls, [place]  01/05/2024, 17:35

## 2024-04-17 NOTE — Progress Notes (Signed)
 [facility] [place], [place] [facility]  [address]  [place] [address]  [phone]      ENCOUNTER DATE: 04/29/2024  Name: Jason Ball  Age: 63 y.o.  DOB: 06/24/1961  Sex: male      CC: Follow Up 6 Months      HPI:  Jason Ball is a 63 yo male with past medical history of iron  deficiency anemia, non-Hodgkin lymphoma, hypertension, asthma, GERD who presents to geriatric clinic today for follow up.  I last saw him in February to establish care.        He has had some recent life stressors including his wife's breast cancer diagnosis who was going through chemotherapy but recently finished treatment and got to ring the bell.  They have been doing weekly treatments which has been really difficult.  She is also going to have thyroid surgery in October.    Their son caron has been helping with appointments as well.      He has been seeing a chiropractor a few times for treatment of back pain but is no longer seeing this person.  He is now seeing a new provider who he gets along with well and feels like she has helped her back.  His right shoulder has been bothering him since he was shoveling gravel and lifting block to help his son build a wall.  He has had issues consistently for the last couple of weeks.  He has had chronic pain in his bilateral shoulders x 1 year but feels that he exacerbated something.  He is able to use his arms to do tasks at shoulder level or below but cannot life his arm above his head to paint above his head.  He has not taken any tylenol  or ibuprofen.   He has taken it for similar issues in the past.      He does report more allergic symptoms recently since he was outside in the woods.  He was around a fire pit last night and need to use his daughter's inhaler which helped.  He was prescribed an albuterol  inhaler to keep on him at last visit but did not fill this.  I asked him to fill his prescription and keep this on him at all times.   He denies symptoms happening outside of known triggers like being outside or around the fire.      He states that his blood pressure has generally been similar to his pressure today at other appointments.  He does not regularly check this at home but was elevated at his last appointment.  We will do a recheck and can consider adding medication if elevated.      He does report some increase in anxiety recently.  His daughter takes Zoloft  and feels that this is very helpful for her.  He feels that anxiety is impacting his sleep.  He usually goes to bed around 8 and gets up around 7 am but wakes up multiple times and thinks about the things he needs to do.  He makes notes for himself in his phone about what he needs to do but still feels like things are running through his head often.  He does feel a sense of fear worrying about what could happen.  He states that Sundays/Mondays are the most stressful as he worries about the week ahead.  He feels comfort from his faith and his support system and denies any depressed mood.  He  has no SI/HI.  We did discuss trying a low dose of Zoloft  would be reasonable to try, and recommended that he do this in combination.  He is agreeable to referral to therapy through [facility] which was placed today.  We discussed common side effects of SSRIs including GI side effects and sexual side effects.  He would like to proceed with starting medication.          Patient Active Problem List   Diagnosis    Asthma    Hypertension    Palpitations    Obesity    Sinusitis, chronic    Lymphoma    Hyperlipidemia    Pyelonephritis       Past Surgical History:   Procedure Laterality Date    BIOPSY  NASAL Left 10/21/2013    Performed by Ramadan, Hassan H, MD at [facility] OR 2 WEST    CASE CANCELLED  07/28/2017    Performed by Huel Newness, MD at [facility] OR MAIN    CYSTOSCOPY N/A 11/13/2019    Performed by Addison Bars, MD at [facility] OR 2 WEST    CYSTOSCOPY WITH URETERAL STENT INSERTION Right 07/28/2017     Performed by Huel Newness, MD at [facility] OR MAIN    HX ADENOIDECTOMY      HX COLONOSCOPY      HX EYE SURGERY      eyelid lift - 2013    HX LYMPH NODE DISSECTION      HX TONSILLECTOMY      URETEROSCOPY WITH HOLMIUM LASER LITHOTRIPSY Right 07/28/2017    Performed by Huel Newness, MD at [facility] OR MAIN    URETHROSCOPY  11/13/2019    Performed by Addison Bars, MD at [facility] OR 2 WEST       albuterol  sulfate (PROVENTIL  OR VENTOLIN  OR PROAIR ) 90 mcg/actuation Inhalation oral inhaler, Take 1-2 Puffs by inhalation Every 6 hours as needed  Amlodipine -Benazepril  10-40 mg Oral Capsule, Take 1 Capsule by mouth Once a day  FEROSUL 325 mg (65 mg iron ) Oral Tablet, Take 1 Tablet (325 mg total) by mouth Every other day  fluticasone  propionate (FLONASE ) 50 mcg/actuation Nasal Spray, Suspension, Administer 1 Spray into each nostril Once a day  loratadine  (CLARITIN ) 10 mg Oral Tablet, Take 1 Tablet (10 mg total) by mouth Once a day  omeprazole (PRILOSEC) 20 mg Oral Capsule, Delayed Release(E.C.), Take 1 Capsule (20 mg total) by mouth Once a day  VITAMIN B-12 1,000 mcg Oral Tablet, SMARTSIG:1 CAPSULE(S) BY MOUTH DAILY    No facility-administered medications prior to visit.      Allergies   Allergen Reactions    Bactrim [Sulfamethoxazole-Trimethoprim]        Family Medical History:       Problem Relation (Age of Onset)    Cancer Father    HTN <20 y.o. Other    Heart Disease Other    No Known Problems Brother, Son, Daughter, Son    Other Mother            Social History     Socioeconomic History    Marital status: Married    Number of children: 3    Years of education: 14   Occupational History    Occupation: self employed    Tobacco Use    Smoking status: Never    Smokeless tobacco: Never   Substance and Sexual Activity    Alcohol use: Yes     Comment: 1 beer every 3 months    Drug use: No  Other Topics Concern    Ability to Walk 1 Flight of Steps without SOB/CP Yes    Routine Exercise No    Ability to Walk 2 Flight of  Steps without SOB/CP Yes    Ability To Do Own ADL's Yes    Other Activity Level Yes     Comment: mechanic activities    Uses Cane No    Uses walker No    Uses wheelchair No    Right hand dominant Yes   Social History Narrative    Denies use of OTC substances (caffeine pills, pseudoephedrine products, dextromethorphan products, and herbal preparations). Denies past/current misuse of prescription medications (opioids, sedatives, stimulants). Denies marijuana use. Denies past/current use of illicit drugs (cocaine, heroin, methamphetamine, hallucinogens, and inhalants).            Review of Systems  Other than ROS in the HPI, all other systems were negative.      BP (!) 140/82 (Site: Left Arm, Patient Position: Sitting)   Pulse 88   Temp 36.3 C (97.3 F) (Temporal)   Ht 1.8 m (5' 10.87)   Wt 123 kg (270 lb 11.6 oz)   SpO2 95%   BMI 37.90 kg/m     General: alert, cooperative, in no distress, vital signs reviewed   Head: Normocephalic, atraumatic   Eyes: pupils equal and round, conjunctiva clear  ENT:  Mucus membranes moist, no visible oral lesions  Respiratory: clear to auscultation bilaterally, no wheezes  Cardiovascular: regular rate and rhythm, no edema   Abdomen: soft, non-tender, bowel sounds normal   Neurology: alert and oriented, no gross motor/sensory deficits.  Psychiatric:  normal mood and affect, thought process linear     COMPREHENSIVE GERIATRIC ASSESSMENT    Functional Status:  does patient require assistance with the following activities?    A. Basic activities of daily living    Bathing No  Dressing No  Toileting No  Maintaining Continence No  Grooming No  Feeding No  Transferring No    B. Instrumental activities of daily living  Shopping for groceries No  Driving or using public transportation No  Using telephone No  Performing house work No  Doing home repairs No  Preparing meals No  Doing laundry No  Taking medications No  Handling finance No    Pain No    Gait No    Fall/balanceNo    Cognition  No, SLUMS 24/30     Mood disorders No, GDS 0/15     Polypharmacy No    Vision Yes, wears glasses     Goal of care/advance care preferences No - discussed and recommended to complete today       ASSESSMENT AND PLAN  Problem List Items Addressed This Visit          Cardiovascular System    Hypertension    Relevant Medications    hydroCHLOROthiazide  (MICROZIDE ) 12.5 mg Oral Capsule    Hyperlipidemia    Relevant Medications    rosuvastatin  (CRESTOR ) 5 mg Oral Tablet       Respiratory    Asthma     Other Visit Diagnoses         Anxiety    -  Primary    Relevant Medications    sertraline  (ZOLOFT ) 25 mg Oral Tablet    Other Relevant Orders    Refer to [facility] Psychotherapy/Clinical Therapy      Shoulder pain, unspecified chronicity, unspecified laterality        Relevant Orders  XR AP SHOULDER RIGHT    Referral to PHYSICAL THERAPY - [place]      Medication monitoring encounter        Relevant Orders    BASIC METABOLIC PANEL      Prediabetes              Right shoulder pain   -he has chronic pain in bilaterally shoulders but with worsening since lifting cinder block and shoveling gravel a few weeks ago   -he has limited active range of motion with abduction, improves with passive movements.  We will start with XR but will also provide referral to PT if no improvement  -we discussed trying Tylenol  at 1000 mg BID and can do an extra dose in the afternoon for days when pain is severe     Hypertension  -blood pressure 140/82 initially with recheck 136/90   -he is currently on amlodipine -benazepril  10-40 mg daily and is not controlled by today's readings or last office visit.  We will add HCTZ 12.5 mg once daily and check BMP in 7-10 days after starting    -last BMP in February 2025 was unremarkable      Anxiety   -he does report recent life stressors as well as excess anxiety/worrying that is impacting his sleep  -we will start low dose Zoloft  25 mg daily (after completion of blood work with starting HCTZ as above) and  referral was placed for therapy today     Hyperlipidemia   -we reviewed his lipid panel from February and calculated his ASCVD risk score which was 12.6%.   He does report remote history of statin use 10-15 years ago which cause muscle cramps.  He is not certain exactly what drug was used but thinks it could be Lipitor   -we discussed dietary modifications to try along with medication, but recommended that he do a trial of Crestor  to see if this will be tolerable for him.  Pravastatin would be reasonable to try after this if he was not able to tolerate a high intensity option   -we will try Crestor  at low dose of 5 mg daily but will stagger the start until after on HCTZ and Zoloft  as above     Prediabetes   -last A1c was 5.9% in February 2025   -we will monitor yearly     Asthma   -well controlled without medications   -albuterol  inhaler prescribed and advised to keep on hand if needed.  This was revisited today and recommended to keep on hand     History of non-hodgkin lymphoma  History of IDA  -follows regularly with heme/oncology at [facility]   -records were received from last office visit February 2025 for follow up of stage IV marginal zone lymphoma (diagnosis in 2015)  -EGD was completed at outside facility and negative per records.   -he has received IV iron  infusions (last one was in April/May of this year)     GERD   -currently well controlled without medication.  He was previously on omeprazole     Healthcare maintenance  -last colonoscopy was January 2025 at [facility] with no polyps.  Recommended follow up in 10 years     The patient was given the opportunity to ask questions and those questions were answered to the patient's satisfaction. The patient was encouraged to call with any additional questions or concerns. Instructed patient to call back if symptoms worsen.   Discussed with patient effects and side effects of medications.  Medication safety was discussed. A copy of the patient's medication list was  printed and given to the patient. Patient's medications were reconciled.     Follow up:  in 3 month(s)    On the day of the encounter, a total of 52 minutes was spent on this patient encounter including review of historical information, examination, documentation and post-visit activities.     Corean Lais, MD 04/29/2024, 09:03  Assistant Professor, Geriatrics and Palliative Medicine

## 2024-04-29 ENCOUNTER — Encounter (HOSPITAL_BASED_OUTPATIENT_CLINIC_OR_DEPARTMENT_OTHER): Payer: Self-pay | Admitting: Student in an Organized Health Care Education/Training Program

## 2024-04-29 ENCOUNTER — Other Ambulatory Visit: Payer: Self-pay

## 2024-04-29 ENCOUNTER — Ambulatory Visit
Payer: Self-pay | Attending: Student in an Organized Health Care Education/Training Program | Admitting: Student in an Organized Health Care Education/Training Program

## 2024-04-29 ENCOUNTER — Ambulatory Visit (HOSPITAL_BASED_OUTPATIENT_CLINIC_OR_DEPARTMENT_OTHER)

## 2024-04-29 ENCOUNTER — Ambulatory Visit (HOSPITAL_BASED_OUTPATIENT_CLINIC_OR_DEPARTMENT_OTHER)
Admission: RE | Admit: 2024-04-29 | Discharge: 2024-04-29 | Disposition: A | Discharge status: Home / Self Care | Source: Ambulatory Visit

## 2024-04-29 VITALS — BP 140/82 | HR 88 | Temp 97.3°F | Ht 70.87 in | Wt 270.7 lb

## 2024-04-29 DIAGNOSIS — M25519 Pain in unspecified shoulder: Secondary | ICD-10-CM

## 2024-04-29 DIAGNOSIS — E785 Hyperlipidemia, unspecified: Secondary | ICD-10-CM | POA: Insufficient documentation

## 2024-04-29 DIAGNOSIS — R7303 Prediabetes: Secondary | ICD-10-CM | POA: Insufficient documentation

## 2024-04-29 DIAGNOSIS — F419 Anxiety disorder, unspecified: Secondary | ICD-10-CM | POA: Insufficient documentation

## 2024-04-29 DIAGNOSIS — Z5181 Encounter for therapeutic drug level monitoring: Secondary | ICD-10-CM | POA: Insufficient documentation

## 2024-04-29 DIAGNOSIS — J45909 Unspecified asthma, uncomplicated: Secondary | ICD-10-CM | POA: Insufficient documentation

## 2024-04-29 DIAGNOSIS — I10 Essential (primary) hypertension: Secondary | ICD-10-CM | POA: Insufficient documentation

## 2024-04-29 MED ORDER — ROSUVASTATIN 5 MG TABLET
5.0000 mg | ORAL_TABLET | Freq: Every evening | ORAL | 4 refills | Status: AC
Start: 2024-04-29 — End: ?

## 2024-04-29 MED ORDER — SERTRALINE 25 MG TABLET: 25.0000 mg | ORAL_TABLET | Freq: Every day | ORAL | 1 refills | Status: DC

## 2024-04-29 MED ORDER — HYDROCHLOROTHIAZIDE 12.5 MG CAPSULE
12.5000 mg | ORAL_CAPSULE | Freq: Every day | ORAL | 4 refills | Status: AC
Start: 2024-04-29 — End: ?

## 2024-04-29 NOTE — Patient Instructions (Addendum)
 Please start taking hydrochlorothiazide  tablet once you have this filled, likely tomorrow morning.  After about 7-10 days of taking this, please get blood work done for me to monitor your sodium level.  This does not need to be fasting blood work.      After I have the results of your blood work, I will contact you on MyChart to let you know results and if sodium is normal, we will start sertraline  (Zoloft ) at that time for mood.  After you have starting Zoloft  and have been on for a few weeks, can start taking rosuvastatin  (Crestor ) tablet at night.

## 2024-04-29 NOTE — Addendum Note (Signed)
 Addended by: Cru Kritikos on: 04/29/2024 10:30 AM     Modules accepted: Level of Service

## 2024-05-01 DIAGNOSIS — M19011 Primary osteoarthritis, right shoulder: Secondary | ICD-10-CM

## 2024-05-02 ENCOUNTER — Ambulatory Visit (HOSPITAL_BASED_OUTPATIENT_CLINIC_OR_DEPARTMENT_OTHER): Payer: Self-pay

## 2024-05-02 NOTE — Telephone Encounter (Signed)
-----   Message from Sotero FALCON, RN sent at 05/02/2024 10:26 AM EDT -----    ----- Message -----  From: Wallace Krabbe, MD  Sent: 05/02/2024   9:22 AM EDT  To: [facility] Clinical Support    Please let him know I reviewed his xray and there is no sign of fracture.  I still recommend to start with PT and can consider orthopedics referral if he is not improving   ----- Message -----  From: Edi, Radresults In  Sent: 05/01/2024   4:54 PM EDT  To: Krabbe Wallace, MD

## 2024-05-13 ENCOUNTER — Ambulatory Visit (HOSPITAL_BASED_OUTPATIENT_CLINIC_OR_DEPARTMENT_OTHER): Payer: Self-pay

## 2024-08-20 ENCOUNTER — Ambulatory Visit: Payer: Self-pay | Admitting: Student in an Organized Health Care Education/Training Program

## 2024-08-20 DIAGNOSIS — Z0000 Encounter for general adult medical examination without abnormal findings: Secondary | ICD-10-CM

## 2024-08-20 DIAGNOSIS — J309 Allergic rhinitis, unspecified: Secondary | ICD-10-CM

## 2024-08-20 DIAGNOSIS — Z5181 Encounter for therapeutic drug level monitoring: Secondary | ICD-10-CM

## 2024-08-20 DIAGNOSIS — I10 Essential (primary) hypertension: Secondary | ICD-10-CM

## 2024-08-20 DIAGNOSIS — E785 Hyperlipidemia, unspecified: Secondary | ICD-10-CM

## 2024-08-20 DIAGNOSIS — R7303 Prediabetes: Secondary | ICD-10-CM

## 2024-08-20 DIAGNOSIS — H9319 Tinnitus, unspecified ear: Secondary | ICD-10-CM

## 2024-08-20 MED ORDER — FLUTICASONE PROPIONATE 50 MCG/ACTUATION NASAL SPRAY,SUSPENSION: 1.0000 | Freq: Every day | NASAL | 1 refills | Status: AC

## 2024-08-20 MED ORDER — LORATADINE 10 MG TABLET: 10.0000 mg | ORAL_TABLET | Freq: Every day | ORAL | 1 refills | Status: AC

## 2024-08-20 MED ORDER — OLOPATADINE 0.1 % EYE DROPS: 1.0000 [drp] | Freq: Two times a day (BID) | OPHTHALMIC | 5 refills | Status: AC | PRN

## 2024-08-20 NOTE — Progress Notes (Signed)
 [facility] [place] [facility]  [address]  [place] [address]  [phone]      ENCOUNTER DATE: 08/20/2024  Name: Britain Saber  Age: 63 y.o.  DOB: [birth year]  Sex: male    TELEMEDICINE DOCUMENTATION:    Patient Location:  MyChart video visit from home address: [address]  [place] [address]    Patient/family aware of provider location:  yes  Patient/family consent for telemedicine:  yes  Examination observed and performed by:  Corean Lais, MD      CC: Follow Up 3 Months      HPI:  Mr. Dewan is a 63 yo male with past medical history of iron  deficiency anemia, non-Hodgkin lymphoma, hypertension, asthma, GERD who presents to geriatric clinic today for follow up.    I last saw him in August.  At that time, he was started on Zoloft , Crestor , and HCTZ.  He was also referred to PT for shoulder pain.      He reports that he is doing ok today.  He has had some issues with congestion at times - some clear and some thicker and yellow.  He has started Claritin  which has helped some.  He has not had any fevers or night sweats, but he does worry because his cancer started in this location.  He will discuss this with his oncologist next month.  He has sinus pressure.  He does have some nasal spray - he looked in his bathroom and it is ipratropium nasal spray.  He has been using this for a while.  He does also report tinnitus over longer period of time - a few months, switching back and forth between his ears.  He finds it distracting but has not noted any hearing loss.  We discussed management of allergies with Claritin , adding Flonase , and olopatadine  drops when needed for eye itchiness and watering.  We will check audiogram and refer to ENT for tinnitus.     He is down 5 lbs from his last visit.  He is trying to lose weight.  He has been taking his HCTZ and Crestor  at night.  He takes his other BP pill in the morning.  He states his pressures have been  doing good at home - 123/80 during our phone call.  He reports other readings are 110s/70's consistently.  He has not noted any side effects with Crestor .  He is having more knee pain and hip pain that started after doing some more physical tasks the last few weeks.  It improves with rest.  He states that pain only occurs with certain movements.  His previously noted right shoulder pain is improving.  His range of motion has also improved.  He feels it will resolve completely on its own.      He states that he still feels like his mood is good.  He has not started Zoloft  which was prescribed last time but doesn't feel like he needs it.  He denies any excess worrying and reports that he is sleeping better.  I will remove Zoloft  from his medication list.        Patient Active Problem List   Diagnosis    Asthma    Hypertension    Palpitations    Obesity    Sinusitis, chronic    Lymphoma    Hyperlipidemia    Pyelonephritis       Past Surgical History:   Procedure Laterality Date  BIOPSY  NASAL Left 10/21/2013    Performed by Ramadan, Hassan H, MD at [facility] OR 2 WEST    CASE CANCELLED  07/28/2017    Performed by Huel Newness, MD at [facility] OR MAIN    CYSTOSCOPY N/A 11/13/2019    Performed by Addison Bars, MD at [facility] OR 2 WEST    CYSTOSCOPY WITH URETERAL STENT INSERTION Right 07/28/2017    Performed by Huel Newness, MD at [facility] OR MAIN    HX ADENOIDECTOMY      HX COLONOSCOPY      HX EYE SURGERY      eyelid lift - 2013    HX LYMPH NODE DISSECTION      HX TONSILLECTOMY      URETEROSCOPY WITH HOLMIUM LASER LITHOTRIPSY Right 07/28/2017    Performed by Huel Newness, MD at [facility] OR MAIN    URETHROSCOPY  11/13/2019    Performed by Addison Bars, MD at [facility] OR 2 WEST       albuterol  sulfate (PROVENTIL  OR VENTOLIN  OR PROAIR ) 90 mcg/actuation Inhalation oral inhaler, Take 1-2 Puffs by inhalation Every 6 hours as needed  Amlodipine -Benazepril  10-40 mg Oral Capsule, Take 1 Capsule by mouth Once a  day  fluticasone  propionate (FLONASE ) 50 mcg/actuation Nasal Spray, Suspension, Administer 1 Spray into each nostril Once a day  hydroCHLOROthiazide  (MICROZIDE ) 12.5 mg Oral Capsule, Take 1 Capsule (12.5 mg total) by mouth Daily  loratadine  (CLARITIN ) 10 mg Oral Tablet, Take 1 Tablet (10 mg total) by mouth Once a day  rosuvastatin  (CRESTOR ) 5 mg Oral Tablet, Take 1 Tablet (5 mg total) by mouth Every evening  sertraline  (ZOLOFT ) 25 mg Oral Tablet, Take 1 Tablet (25 mg total) by mouth Daily    No facility-administered medications prior to visit.      Allergies   Allergen Reactions    Bactrim [Sulfamethoxazole-Trimethoprim]        Family Medical History:       Problem Relation (Age of Onset)    Cancer Father    HTN <20 y.o. Other    Heart Disease Other    No Known Problems Brother, Son, Daughter, Son    Other Mother            Social History     Socioeconomic History    Marital status: Married    Number of children: 3    Years of education: 14   Occupational History    Occupation: self employed    Tobacco Use    Smoking status: Never    Smokeless tobacco: Never   Substance and Sexual Activity    Alcohol use: Yes     Comment: 1 beer every 3 months    Drug use: No   Other Topics Concern    Ability to Walk 1 Flight of Steps without SOB/CP Yes    Routine Exercise No    Ability to Walk 2 Flight of Steps without SOB/CP Yes    Ability To Do Own ADL's Yes    Other Activity Level Yes     Comment: curator activities    Uses Cane No    Uses walker No    Uses wheelchair No    Right hand dominant Yes   Social History Narrative    Denies use of OTC substances (caffeine pills, pseudoephedrine products, dextromethorphan products, and herbal preparations). Denies past/current misuse of prescription medications (opioids, sedatives, stimulants). Denies marijuana use. Denies past/current use of illicit drugs (cocaine, heroin, methamphetamine, hallucinogens, and inhalants).  Review of Systems  Other than ROS in the HPI, all  other systems were negative.      There were no vitals taken for this visit.  Home BP was 123/80 during visit     General: alert, cooperative, in no distress, vital signs reviewed   Head: Normocephalic, atraumatic   Eyes: pupils equal and round, conjunctiva clear  ENT:  Mucus membranes moist, no visible oral lesions  Respiratory:  breathing comfortable on room air   Neurology: alert and oriented, no gross motor/sensory deficits.  Psychiatric:  normal mood and affect, thought process linear      COMPREHENSIVE GERIATRIC ASSESSMENT    Functional Status:  does patient require assistance with the following activities?    A. Basic activities of daily living    Bathing No  Dressing No  Toileting No  Maintaining Continence No  Grooming No  Feeding No  Transferring No    B. Instrumental activities of daily living  Shopping for groceries No  Driving or using public transportation No  Using telephone No  Performing house work No  Doing home repairs No  Preparing meals No  Doing laundry No  Taking medications No  Handling finance No    Pain No    Gait No    Fall/balanceNo    Cognition No, SLUMS 24/30     Mood disorders No, GDS 0/15     Polypharmacy No    Vision Yes, wears glasses     Goal of care/advance care preferences No - discussed and recommended to complete today       ASSESSMENT AND PLAN  Problem List Items Addressed This Visit          Cardiovascular System    Hypertension    Relevant Orders    BASIC METABOLIC PANEL    Hyperlipidemia - Primary    Relevant Orders    LIPID PANEL    HEPATIC FUNCTION PANEL     Other Visit Diagnoses         Medication monitoring encounter        Relevant Orders    BASIC METABOLIC PANEL    HEPATIC FUNCTION PANEL      Prediabetes        Relevant Orders    HGA1C (HEMOGLOBIN A1C WITH EST AVG GLUCOSE)      Allergic rhinitis        Relevant Medications    fluticasone  propionate (FLONASE ) 50 mcg/actuation Nasal Spray, Suspension    loratadine  (CLARITIN ) 10 mg Oral Tablet    olopatadine  (PATANOL)  0.1 % ophthalmic solution      Tinnitus, unspecified laterality        Relevant Orders    Refer for Audiology Evaluation-POC    Refer to [facility] ENT,[facility] [employer] maintenance              Allergic rhinitis   -continue with Claritin    -start Flonase  nasal spray   -we discussed trying olopatadine  eye drops for itchy/watery eyes  -prescriptions were sent for Clariti, Flonase , and olopatadine  drops      Tinnitus   -he reports bilateral tinnitus - often in one ear at a time but can be on either side  -he does feel like it is distracting for his hearing but hasn't noted hearing loss otherwise   -we will order audiogram and ENT referral     Hypertension  -blood pressure at last visit was uncontrolled, and he was started on HCTZ   -  current regimen includes amlodipine -benazepril  10-40 mg daily and HCTZ 12.5 mg once daily   -his home blood pressures are well controlled - 110s/70's.  On our call, his BP was 123/80   -follow up BMP was ordered after last visit but not yet completed - he is planning to do this next week       Anxiety   -he does report recent life stressors as well as excess anxiety/worrying that is impacting his sleep  -he was started on Zoloft  25 mg daily at last visit and therapy referral was placed - he has not started this but does feel that mood is well controlled at this time.  He denies episodes of low mood or worrying.  He is sleeping better.  We will continue to monitor     Hyperlipidemia   -ASCVD risk score with lipid panel from February 2025 was 12.6%.     -he has history of muscle cramps with Lipitor.  We discussed dietary modifications to try along with medication, but recommended that he do a trial of Crestor  to see if this will be tolerable for him.  Pravastatin would be reasonable to try after this if he was not able to tolerate a high intensity option   -Crestor  5 mg daily started at last visit which he is tolerating well at this time - we discussed possibility of  increasing his dose to 10 mg daily but elected to wait at this time.  He will get an updated panel prior to his next visit       Prediabetes   -last A1c was 5.9% in February 2025   -we will monitor yearly     Asthma   -well controlled without medications   -albuterol  inhaler has been prescribed and advised to keep on hand if needed.      History of non-hodgkin lymphoma  History of IDA  -follows regularly with heme/oncology at [facility]   -records were received from last office visit February 2025 for follow up of stage IV marginal zone lymphoma (diagnosis in 2015)  -EGD was completed at outside facility and negative per records.   -he has received IV iron  infusions (last one was in April/May 2025)   -he has follow up scheduled for 12/12 at [facility]     GERD   -currently well controlled without medication.  He was previously on omeprazole     Healthcare maintenance  -last colonoscopy was January 2025 at [facility] with no polyps.  Recommended follow up in 10 years   -seasonal vaccines:  flu and covid reviewed with patient today and he is planning to do this when gets his labs done.  He will get this done at [facility]   -we did discuss getting a TDaP booster done at the pharmacy as well     The patient was given the opportunity to ask questions and those questions were answered to the patient's satisfaction. The patient was encouraged to call with any additional questions or concerns. Instructed patient to call back if symptoms worsen.   Discussed with patient effects and side effects of medications. Medication safety was discussed. A copy of the patient's medication list was printed and given to the patient. Patient's medications were reconciled.     Follow up:  in 3 month(s)    On the day of the encounter, a total of 37 minutes was spent on this patient encounter including review of historical information, examination, documentation and post-visit activities.     Ormand Senn,  MD 08/20/2024, 11:13  Assistant Professor,  Geriatrics and Palliative Medicine

## 2024-08-28 ENCOUNTER — Ambulatory Visit (INDEPENDENT_AMBULATORY_CARE_PROVIDER_SITE_OTHER): Payer: Self-pay | Admitting: PHYSICIAN ASSISTANT

## 2024-09-02 ENCOUNTER — Ambulatory Visit (INDEPENDENT_AMBULATORY_CARE_PROVIDER_SITE_OTHER): Admitting: PHYSICIAN ASSISTANT

## 2024-09-16 ENCOUNTER — Ambulatory Visit (INDEPENDENT_AMBULATORY_CARE_PROVIDER_SITE_OTHER): Admitting: Audiologist

## 2024-09-16 ENCOUNTER — Ambulatory Visit (INDEPENDENT_AMBULATORY_CARE_PROVIDER_SITE_OTHER): Admitting: PHYSICIAN ASSISTANT

## 2024-09-16 ENCOUNTER — Encounter (INDEPENDENT_AMBULATORY_CARE_PROVIDER_SITE_OTHER): Payer: Self-pay | Admitting: PHYSICIAN ASSISTANT

## 2024-09-16 ENCOUNTER — Other Ambulatory Visit: Payer: Self-pay

## 2024-09-16 VITALS — Temp 98.2°F | Ht 71.0 in | Wt 276.0 lb

## 2024-09-16 DIAGNOSIS — H903 Sensorineural hearing loss, bilateral: Secondary | ICD-10-CM

## 2024-09-16 DIAGNOSIS — H9313 Tinnitus, bilateral: Secondary | ICD-10-CM

## 2024-09-16 NOTE — Progress Notes (Signed)
 Patient referred for a hearing evaluation by Joesph Clubs. Tympanometry reveals normal middle ear pressure and compliance, bilaterally. Pure tone testing using high-frequency headphones reveals normal sloping to moderate sensorineural hearing loss, bilaterally. SRT is in agreement with PTA, bilaterally. Word recognition testing scores using recorded word lists are 100% in the right ear and 100% in the left ear. Recommendations and medical management per Joesph Clubs. HAE pending patient motivation and medical clearance.    Rosaline Glatter, Au.D., CCC-A  Clinical Audiologist  [facility]

## 2024-09-16 NOTE — H&P (Signed)
 [place]  [place]  DEPARTMENT OF OTOLARYNGOLOGY - HEAD AND NECK SURGERY  CLINIC H&P NOTE    Name: Jes Costales, 63 y.o. male  MRN: [id number]  Date of Birth: 1961/03/21  Date of Service: 09/16/2024    Chief Complaint:    Chief Complaint   Patient presents with    Ringing in Ear       Subjective: This is a 63 y.o. male presenting today for tinnitus evaluation. Patient reports intermittent ringing tinnitus of both ears that has been on going for several years. His tinnitus is not overly bothersome to him and does not affect his sleep quality. He denies concerns for hearing loss. He denies otalgia and otorrhea. Denies recurrent ear infections or history of ear surgery. No other concerns today.     Past Medical History:  Past Medical History:   Diagnosis Date    Asthma     well controlled without medications    Blurred vision     H/O urinary tract infection     10/28/19 antibiotics    HTN (hypertension)     Hyperlipidemia 02/02/2017    Hypertension     Lymphoma     Obesity     Palpitations     with anxiety    Pyelonephritis 10/28/2019    Ringing in ears     Sinusitis, chronic     Tattoos     x1 left upper arm           Medications:  Outpatient Medications Marked as Taking for the 09/16/24 encounter (Office Visit) with Merle Search, PA-C   Medication Sig    albuterol  sulfate (PROVENTIL  OR VENTOLIN  OR PROAIR ) 90 mcg/actuation Inhalation oral inhaler Take 1-2 Puffs by inhalation Every 6 hours as needed    Amlodipine -Benazepril  10-40 mg Oral Capsule Take 1 Capsule by mouth Once a day    fluticasone  propionate (FLONASE ) 50 mcg/actuation Nasal Spray, Suspension Administer 1 Spray into each nostril Daily    hydroCHLOROthiazide  (MICROZIDE ) 12.5 mg Oral Capsule Take 1 Capsule (12.5 mg total) by mouth Daily    loratadine  (CLARITIN ) 10 mg Oral Tablet Take 1 Tablet (10 mg total) by mouth Daily    olopatadine  (PATANOL) 0.1 % ophthalmic solution Instill 1 Drop into both eyes Twice per day as needed for Allergies    rosuvastatin   (CRESTOR ) 5 mg Oral Tablet Take 1 Tablet (5 mg total) by mouth Every evening       Allergies:  Allergies[1]     Physical Exam:  Temp 36.8 C (98.2 F) (Thermal Scan)   Ht 1.803 m (5' 11)   Wt 125 kg (276 lb 0.3 oz)   BMI 38.50 kg/m       General Appearance: Well-appearing 63 y.o. male who is cooperative and in no acute distress.   Face: Symmetric, and without obvious lesions. Non-syndromic facies.   Ears: Pinnae normal shape and position bilaterally.   Left: External auditory canal patent and dry. Tympanic membrane intact, healthy-appearing, mid-position, and with good middle ear aeration.  Right: External auditory canal patent and dry. Tympanic membrane intact, healthy-appearing, mid-position, and with good middle ear aeration.  Respiratory: No apparent stridulous breathing. No acute respiratory distress.      Review of Information:  Audiogram from today reviewed and interpreted: Normal to a moderate SNHL AU      SRT   Right: 25  dB   Left: 25 dB  SDS   Right: 100% at 65 dB   Left: 100% at 65 dB  Tymp  Right: type A (normal)    Left: type A (normal)     Special Procedures:       Orders Placed at Today's Visit:   Orders Placed This Encounter    ENT AUDBASE INTERFACE ORDER    AUDIOLOGY EVALUATION(REF AUDIOLOGY)-[facility]       Assessment & Plan:    ICD-10-CM    1. Sensorineural hearing loss (SNHL) of both ears  H90.3       2. Tinnitus of both ears  H93.13 AUDIOLOGY EVALUATION(REF AUDIOLOGY)-[facility]          -Buell Parcel is a 63 y.o. male with bilateral non-pulsatile tinnitus. Audiogram performed today showed symmetric bilateral SNHL. Discussed utilization of tinnitus maskers, decreasing caffeine intake, stress/anxiety reduction, and maintaining good sleep habits.     Follow up with me prn.    Thank you for the privilege of allowing me to care for [facility] [place].  Please do not hesitate to contact me if you have any questions.     Joesph Clubs, PA-C 09/16/2024 18:01   Otolaryngology      Joesph Clubs,  PA-C        CC:    PCP Corean Lais, MD  [address]  [place] [id number]   Referring Provider Lais Corean, MD  [address]  [address]  [place],  [place] [address]            [1]   Allergies  Allergen Reactions    Bactrim [Sulfamethoxazole-Trimethoprim]

## 2024-12-09 ENCOUNTER — Ambulatory Visit (HOSPITAL_BASED_OUTPATIENT_CLINIC_OR_DEPARTMENT_OTHER): Payer: Self-pay | Admitting: Student in an Organized Health Care Education/Training Program
